# Patient Record
Sex: Female | Born: 1955 | Race: White | Hispanic: No | Marital: Married | Smoking: Never smoker
Health system: Southern US, Community
[De-identification: ages and names within clinical notes are randomized; demographics above are authoritative.]

## PROBLEM LIST (undated history)

## (undated) DIAGNOSIS — I214 Non-ST elevation (NSTEMI) myocardial infarction: Secondary | ICD-10-CM

## (undated) DIAGNOSIS — F32A Depression, unspecified: Secondary | ICD-10-CM

## (undated) DIAGNOSIS — E785 Hyperlipidemia, unspecified: Secondary | ICD-10-CM

## (undated) DIAGNOSIS — I10 Essential (primary) hypertension: Secondary | ICD-10-CM

## (undated) DIAGNOSIS — E119 Type 2 diabetes mellitus without complications: Secondary | ICD-10-CM

## (undated) DIAGNOSIS — F329 Major depressive disorder, single episode, unspecified: Secondary | ICD-10-CM

## (undated) DIAGNOSIS — I2510 Atherosclerotic heart disease of native coronary artery without angina pectoris: Secondary | ICD-10-CM

## (undated) HISTORY — DX: Non-ST elevation (NSTEMI) myocardial infarction: I21.4

## (undated) HISTORY — DX: Essential (primary) hypertension: I10

## (undated) HISTORY — PX: VAGINAL HYSTERECTOMY: SUR661

## (undated) HISTORY — PX: BREAST SURGERY: SHX581

## (undated) HISTORY — PX: BLADDER SUSPENSION: SHX72

## (undated) HISTORY — DX: Type 2 diabetes mellitus without complications: E11.9

## (undated) HISTORY — PX: TUBAL LIGATION: SHX77

## (undated) HISTORY — PX: TONSILLECTOMY: SUR1361

---

## 1986-12-27 HISTORY — PX: BACK SURGERY: SHX140

## 2002-12-27 HISTORY — PX: CHOLECYSTECTOMY: SHX55

## 2003-08-09 ENCOUNTER — Ambulatory Visit (HOSPITAL_COMMUNITY): Admission: RE | Admit: 2003-08-09 | Discharge: 2003-08-09 | Payer: Self-pay | Admitting: General Surgery

## 2004-06-01 ENCOUNTER — Observation Stay (HOSPITAL_COMMUNITY): Admission: EM | Admit: 2004-06-01 | Discharge: 2004-06-02 | Payer: Self-pay | Admitting: Cardiology

## 2007-11-30 ENCOUNTER — Emergency Department (HOSPITAL_COMMUNITY): Admission: EM | Admit: 2007-11-30 | Discharge: 2007-11-30 | Payer: Self-pay | Admitting: Emergency Medicine

## 2011-05-14 NOTE — H&P (Signed)
   NAME:  Andrea Hughes, HAZELBAKER                        ACCOUNT NO.:  [id number]   MEDICAL RECORD NO.:  [id number]                   PATIENT TYPE:  AMB   LOCATION:  DAY                                  FACILITY:  [facility]   PHYSICIAN:  Dalia Heading, M.D.               DATE OF BIRTH:  April 24, 1956   DATE OF ADMISSION:  DATE OF DISCHARGE:                                HISTORY & PHYSICAL   CHIEF COMPLAINT:  Biliary colic, cholelithiasis.   HISTORY OF PRESENT ILLNESS:  The patient is a 55 year old white female who  was referred for evaluation and treatment of biliary colic secondary to  cholelithiasis.  She has been having right upper quadrant discomfort,  nausea, post prandial symptoms, fatty food intolerance, indigestion, and  bloating for some time.  The pain radiates to her right flank.  No fever,  chills, jaundice have been noted.  The symptoms seem to be worsening.   PAST MEDICAL HISTORY:  Hypertension.   PAST SURGICAL HISTORY:  1. Breast surgery.  2. Tubal ligation.  3. Back surgery.   CURRENT MEDICATIONS:  Triamterene/hydrochlorothiazide 37.5/25 mg p.o. every  day.   ALLERGIES:  Contrast dye.   REVIEW OF SYSTEMS:  Noncontributory.   PHYSICAL EXAMINATION:  GENERAL:  The patient is an obese white female in no  acute distress.  VITAL SIGNS:  She is afebrile and vital signs are stable.  HEENT:  Reveals no scleral icterus.  LUNGS:  Clear to auscultation with equal breath sounds bilaterally.  HEART:  Reveals a regular, rate and rhythm without S3, S4 or murmurs.  ABDOMEN:  Soft and nondistended.  She is slightly tender in the right upper  quadrant to palpation.  No hepatosplenomegaly, masses, hernias are  identified.   Ultrasound of the gallbladder reveals cholelithiasis with a normal common  bile duct.   IMPRESSION:  1. Biliary colic.  2. Cholelithiasis.    PLAN:  The patient was scheduled for laparoscopic cholecystectomy on August 09, 2003.  The risks and benefits of the  procedure including bleeding,  infection, hepatobiliary injury, the possibility of an open procedure were  fully explained to the patient, gained informed consent.                                                 Dalia Heading, M.D.    MAJ/MEDQ  D:  08/06/2003  T:  08/06/2003  Job:  [id number]

## 2011-05-14 NOTE — Cardiovascular Report (Signed)
NAME:  Andrea Hughes, Andrea Hughes                        ACCOUNT NO.:  [id number]   MEDICAL RECORD NO.:  [id number]                   PATIENT TYPE:  INP   LOCATION:  3727                                 FACILITY:  [facility]   PHYSICIAN:  Carole Binning, M.D. [facility]         DATE OF BIRTH:  1956/05/06   DATE OF PROCEDURE:  06/02/2004  DATE OF DISCHARGE:  06/02/2004                              CARDIAC CATHETERIZATION   PROCEDURE PERFORMED:  Left heart catheterization with coronary angiography  and left ventriculography.   CARDIOLOGIST:  Carole Binning, M.D.   INDICATIONS:  [name] is a 55 year old woman with multiple cardiac risk  factors including diabetes mellitus and metabolic syndrome.  She presented  with chest pain to [facility].  A stress Cardiolite  performed showed an inferior defect consistent with diaphragmatic  attenuation or inferior infarct.  Because of the uncertainty of this  Cardiolite she was referred for cardiac catheterization to rule out coronary  artery disease.   DESCRIPTION OF PROCEDURE:  A 6 French sheath was placed in the left femoral  artery.  Coronary angiography was performed with standard Judkins 6 French  catheters.  Left ventriculography was performed with an angled pigtail  catheter.  Contrast was Omnipaque.  There were no complications.   RESULTS:  HEMODYNAMICS:  Left ventricular pressure 134/6. Aortic pressure  140/92.  There was no aortic valve gradient.   LEFT VENTRICULOGRAM:  The wall motion is normal.  Ejection fraction is  estimated at greater than or equal to 60%.  There is no mitral  regurgitation.   CORONARY ARTERIOGRAPHY:  (Right dominant).   Left main:  Normal.   Left anterior descending coronary artery gives rise to a small first  diagonal branch, a normal second diagonal branch.  The LAD is normal.   The left circumflex gives rise to a small ramus intermedius, a very large  branching first obtuse marginal, a normal size  second obtuse marginal and a  small posterior lateral branch.  There is less than 20% stenosis in the  origin of the first obtuse marginal branch.  Otherwise, the left circumflex  is normal.   The right coronary artery is a dominant vessel giving rise to a large  posterior descending artery which arises from the acute margin.  There is a  small posterior lateral branch.   IMPRESSION:  1. Normal left ventricular systolic function.  2. No significant coronary artery disease identified.                                               Carole Binning, M.D. [facility]    MWP/MEDQ  D:  06/02/2004  T:  06/04/2004  Job:  [id number]   cc:   Selinda Flavin  [address] [place], [address]  [place]  [place] [address]  Fax: [phone]   [facility]   Cardiac Catheterization Lab

## 2011-05-14 NOTE — Op Note (Signed)
NAME:  Andrea Hughes, Andrea Hughes                        ACCOUNT NO.:  [id number]   MEDICAL RECORD NO.:  [id number]                   PATIENT TYPE:  AMB   LOCATION:  DAY                                  FACILITY:  [facility]   PHYSICIAN:  Dalia Heading, M.D.               DATE OF BIRTH:  [birth year]   DATE OF PROCEDURE:  08/09/2003  DATE OF DISCHARGE:                                 OPERATIVE REPORT   PREOPERATIVE DIAGNOSIS:  Cholecystitis, cholelithiasis.   POSTOPERATIVE DIAGNOSIS:  Cholecystitis, cholelithiasis.   PROCEDURE:  Laparoscopic cholecystectomy.   SURGEON:  Dalia Heading, M.D.   ASSISTANT:  Bernerd Limbo. Leona Carry, M.D.   ANESTHESIA:  General endotracheal.   INDICATIONS FOR PROCEDURE:  The patient is a 55 year old white female who  was referred for evaluation and treatment of biliary colic secondary to  cholelithiasis.  The risks and benefits of the procedure, including  bleeding, infection, hepatobiliary injury, and the possibility of an open  procedure were fully explained to the patient who gave informed consent.   DESCRIPTION OF PROCEDURE:  The patient was placed in the supine position.  After induction of general endotracheal anesthesia, the abdomen was prepped  and draped using the usual sterile technique with Betadine.  Surgical site  confirmation was performed.   A supraumbilical incision was made down to the fascia.  A Veress needle was  introduced into the abdominal cavity, and confirmation of placement was done  using the saline drop test.  The abdomen was then insufflated to 16 mmHg  pressure.  An 11-mm trocar was introduced into the abdominal cavity under  direct visualization without difficulty.  An additional 11-mm trocar was  placed in the epigastric region and 5-mm trocars were placed in the right  upper quadrant and right flank regions.  The liver was inspected and noted  to be within normal limits.  The gallbladder was retracted superiorly and  laterally.  The  dissection was begun around the infundibulum of the  gallbladder.  The cystic duct was first identified.  Its juncture to the  infundibulum was fully identified.  Endoclips were placed proximally and  distally on the cystic duct, and the cystic duct was divided.  This was  likewise done on the cystic artery.  The gallbladder was then freed away  from the gallbladder fossa using Bovie electrocautery.  The gallbladder was  delivered through the epigastric trocar site using an EndoCatch bag without  difficulty.  The gallbladder fossa was inspected, and no abnormal bleeding  or bile leakage was noted.  Surgicel was placed in the gallbladder fossa.  The subhepatic space as well as right hepatic gutter were irrigated with  normal saline.  All fluid and air were then evacuated from the abdominal  cavity prior to removal of the trocars.   All wounds were irrigated with normal saline.  All wounds were injected with  0.5% Sensorcaine.  The supraumbilical  fascia was reapproximated using an 0  Vicryl interrupted suture.  All skin incisions were closed using staples.  Betadine ointment and dry sterile dressings were applied.   All tape and needle counts were correct at the end of the procedure.  The  patient was extubated in the operating room and went back to the recovery  room awake and in stable condition.   COMPLICATIONS:  None.   SPECIMENS:  Gallbladder with stones.   ESTIMATED BLOOD LOSS:  Minimal.                                               Dalia Heading, M.D.    MAJ/MEDQ  D:  08/09/2003  T:  08/09/2003  Job:  [id number]

## 2011-05-14 NOTE — Discharge Summary (Signed)
NAME:  ALEECIA, TAPIA                        ACCOUNT NO.:  [id number]   MEDICAL RECORD NO.:  [id number]                   PATIENT TYPE:  INP   LOCATION:  3727                                 FACILITY:  [facility]   PHYSICIAN:  Rollene Rotunda, M.D.                DATE OF BIRTH:  28-Mar-1956   DATE OF ADMISSION:  06/01/2004  DATE OF DISCHARGE:  06/02/2004                                 DISCHARGE SUMMARY   PROCEDURES:  1. Cardiac catheterization.  2. Coronary arteriogram.  3. Left ventriculogram.   HOSPITAL COURSE:  Ms. Mellone is a 55 year old female with no known history  of coronary artery disease.  She went to [facility] on May 30, 2004  for chest pain for which she was seen by cardiology.  She ruled out for a  myocardial infarction and had a Cardiolite on June 01, 2004.  This showed  possible inferior defect and because of that she was transferred to [facility] for further evaluation and treatment.   Her potassium was 3.1 at [facility] and because of that she was supplemented  and started on a potassium tablet at 10 mEq daily. Prior to admission she  had been on Maxzide and it was felt that this was the cause.  Additionally,  it was found out that she has an ALLERGY to CONTRAST DYE and she was  pretreated for this with steroids, Benadryl and Pepcid.  The cardiac  catheterization was performed and showed a less than 20% stenosis in the  circumflex and no other significant disease and a normal ejection fraction.  Dr. Gerri Spore reviewed the films and felt that she had no significant  coronary artery disease and could be followed as an outpatient.   Post catheterization her right groin was stable and it was felt she could be  discharged with outpatient follow up.   CONDITION ON DISCHARGE:  Improved.   DISCHARGE DIAGNOSES:  1. Chest pain, no critical coronary artery disease by catheterization this     admission.  2. Hypertension.  3. Hyperlipidemia.  4.  Diabetes.  5. Obesity.  6. Family history of valvular heart disease but no history of premature     ischemic heart disease.   DISCHARGE INSTRUCTIONS:  1. Her activity level is to include no strenuous activity for two days.  She     is to call the office for any problems with the catheterization site.  2. She is to stick to a low fat, diabetic diet.  3. She is to follow up with Dr. Neita Carp and is to follow up with cardiology     on a PRN basis.   DISCHARGE MEDICATIONS:  1. Avandamet 2/500 b.i.d.  2. Maxzide 25/37.5 mg daily.  3. Lipitor 20 mg daily.      Theodore Demark, P.A. [facility]                  Fayrene Fearing  Antoine Poche, M.D.    RB/MEDQ  D:  06/02/2004  T:  06/03/2004  Job:  [id number]   cc:   Selinda Flavin  [address]  [place]  [place] [address]  Fax: [phone]

## 2011-07-29 ENCOUNTER — Emergency Department (HOSPITAL_COMMUNITY): Payer: Worker's Compensation

## 2011-07-29 ENCOUNTER — Emergency Department (HOSPITAL_COMMUNITY)
Admission: EM | Admit: 2011-07-29 | Discharge: 2011-07-29 | Disposition: A | Discharge status: Home / Self Care | Payer: Worker's Compensation | Attending: Emergency Medicine | Admitting: Emergency Medicine

## 2011-07-29 DIAGNOSIS — S335XXA Sprain of ligaments of lumbar spine, initial encounter: Secondary | ICD-10-CM | POA: Insufficient documentation

## 2011-07-29 DIAGNOSIS — E119 Type 2 diabetes mellitus without complications: Secondary | ICD-10-CM | POA: Insufficient documentation

## 2011-07-29 DIAGNOSIS — I10 Essential (primary) hypertension: Secondary | ICD-10-CM | POA: Insufficient documentation

## 2011-07-29 DIAGNOSIS — M545 Low back pain, unspecified: Secondary | ICD-10-CM | POA: Insufficient documentation

## 2011-07-29 DIAGNOSIS — Z79899 Other long term (current) drug therapy: Secondary | ICD-10-CM | POA: Insufficient documentation

## 2011-07-29 DIAGNOSIS — W07XXXA Fall from chair, initial encounter: Secondary | ICD-10-CM | POA: Insufficient documentation

## 2011-08-12 NOTE — H&P (Signed)
NTS SOAP Note  Vital Signs:  Vitals as of: 08/12/2011: Systolic 158: Diastolic 103: Heart Rate 68: Temp 41F: Height 64ft 5in: Weight 302Lbs 0 Ounces: Pain Level 0: BMI 50  BMI : 50.25 kg/m2  Subjective: This 55 Years 1 Months old Female presents for of need for screening TCS. Denies any GI complaints.  Review of Symptoms:  Constitutional: unremarkable  Head: unremarkable  Eyes:unremarkable  Nose/Mouth/Throat:unremarkable  Cardiovascular:unremarkable  Respiratory: unremarkable  Gastrointestinal:unremarkable  Genitourinary: unremarkable  Musculoskeletal: unremarkable  dry skin Breast: unremarkable  Hematolgic/Lymphatic: unremarkable  Allergic/Immunologic:unremarkable     Past Medical History:Reviewed  Past Medical History  Surgical History: breast surgery, cholecystectomy, BTL Medical Problems: Diabetes, High Blood pressure Allergies: pcn, contrast dye, sulfur Medications: losartin/HCTZ, metformin   Social History: Reviewed   Social History  Preferred Language: English ([place]) Race: White Ethnicity: Not Hispanic / Latino Age: 55 Years 0 Months Marital Status: M Alcohol: No Recreational drug(s): No   Smoking Status: Never smoker reviewed on 08/12/2011  Family History: Reviewed  Family History  Is there a family history of:No family h/o colon carcinoma Father: Cancer Mother: Diabetes Type II   Objective Information:  General: Well appearing, well nourished in no distress.  Head: Atraumatic; no masses; no abnormalities  Neck: Supple without lymphadenopathy.  Heart: RRR, no murmur or gallop. Normal S1, S2. No S3, S4.  Lungs:CTA bilaterally, no wheezes, rhonchi, rales. Breathing unlabored.  Abdomen: Soft, NT/ND, normal bowel sounds, no HSM, no masses. No peritoneal signs.  deferred to procedure  Assessment: Need for screening TCS  Diagnosis & Procedure: DiagnosisCode: V76.51, ProcedureCode: 16109,   Orders:1/2 lyte  prescribed     Plan:Scheduled for TCS on 09/07/11.    Patient Education: Alternative treatments to surgery were discussed with patient (and family).Risks and benefits of procedure were fully explained to the patient (and family) who gave informed consent. Patient/family questions were addressed.  Follow-up: Pending Surgery

## 2011-09-06 MED ORDER — SODIUM CHLORIDE 0.45 % IV SOLN
Freq: Once | INTRAVENOUS | Status: AC
  Administered 2011-09-07: 08:00:00 via INTRAVENOUS

## 2011-09-07 ENCOUNTER — Encounter (HOSPITAL_COMMUNITY): Payer: Self-pay

## 2011-09-07 ENCOUNTER — Ambulatory Visit (HOSPITAL_COMMUNITY)
Admission: RE | Admit: 2011-09-07 | Discharge: 2011-09-07 | Disposition: A | Discharge status: Home / Self Care | Payer: 59 | Source: Ambulatory Visit | Attending: General Surgery | Admitting: General Surgery

## 2011-09-07 ENCOUNTER — Encounter (HOSPITAL_COMMUNITY)
Admission: RE | Disposition: A | Discharge status: Home / Self Care | Payer: Self-pay | Source: Ambulatory Visit | Attending: General Surgery

## 2011-09-07 DIAGNOSIS — Z79899 Other long term (current) drug therapy: Secondary | ICD-10-CM | POA: Insufficient documentation

## 2011-09-07 DIAGNOSIS — Z01812 Encounter for preprocedural laboratory examination: Secondary | ICD-10-CM | POA: Insufficient documentation

## 2011-09-07 DIAGNOSIS — Z1211 Encounter for screening for malignant neoplasm of colon: Secondary | ICD-10-CM | POA: Insufficient documentation

## 2011-09-07 DIAGNOSIS — E119 Type 2 diabetes mellitus without complications: Secondary | ICD-10-CM | POA: Insufficient documentation

## 2011-09-07 HISTORY — PX: COLONOSCOPY: SHX5424

## 2011-09-07 HISTORY — DX: Depression, unspecified: F32.A

## 2011-09-07 HISTORY — DX: Major depressive disorder, single episode, unspecified: F32.9

## 2011-09-07 LAB — GLUCOSE, CAPILLARY: Glucose-Capillary: 199 mg/dL — ABNORMAL HIGH (ref 70–99)

## 2011-09-07 SURGERY — COLONOSCOPY
Anesthesia: Moderate Sedation

## 2011-09-07 MED ORDER — MEPERIDINE HCL 25 MG/ML IJ SOLN
INTRAMUSCULAR | Status: DC | PRN
  Administered 2011-09-07: 50 mg via INTRAVENOUS

## 2011-09-07 MED ORDER — STERILE WATER FOR IRRIGATION IR SOLN
Status: DC | PRN
  Administered 2011-09-07: 08:00:00

## 2011-09-07 MED ORDER — MIDAZOLAM HCL 5 MG/5ML IJ SOLN
INTRAMUSCULAR | Status: DC | PRN
  Administered 2011-09-07: 4 mg via INTRAVENOUS
  Administered 2011-09-07: 1 mg via INTRAVENOUS

## 2011-09-07 MED ORDER — MEPERIDINE HCL 100 MG/ML IJ SOLN
INTRAMUSCULAR | Status: AC
  Filled 2011-09-07: qty 1

## 2011-09-07 MED ORDER — MIDAZOLAM HCL 5 MG/5ML IJ SOLN
INTRAMUSCULAR | Status: AC
  Filled 2011-09-07: qty 5

## 2011-09-15 ENCOUNTER — Encounter (HOSPITAL_COMMUNITY): Payer: Self-pay | Admitting: General Surgery

## 2014-09-26 DIAGNOSIS — I214 Non-ST elevation (NSTEMI) myocardial infarction: Secondary | ICD-10-CM

## 2014-09-26 HISTORY — DX: Non-ST elevation (NSTEMI) myocardial infarction: I21.4

## 2014-10-18 ENCOUNTER — Encounter (HOSPITAL_COMMUNITY): Payer: Self-pay | Admitting: Cardiology

## 2014-10-18 ENCOUNTER — Inpatient Hospital Stay (HOSPITAL_COMMUNITY)
Admission: AD | Admit: 2014-10-18 | Discharge: 2014-10-22 | DRG: 247 | Disposition: A | Discharge status: Home / Self Care | Payer: 59 | Source: Ambulatory Visit | Attending: Cardiology | Admitting: Cardiology

## 2014-10-18 DIAGNOSIS — I25110 Atherosclerotic heart disease of native coronary artery with unstable angina pectoris: Secondary | ICD-10-CM | POA: Diagnosis present

## 2014-10-18 DIAGNOSIS — I119 Hypertensive heart disease without heart failure: Secondary | ICD-10-CM | POA: Diagnosis present

## 2014-10-18 DIAGNOSIS — I214 Non-ST elevation (NSTEMI) myocardial infarction: Principal | ICD-10-CM

## 2014-10-18 DIAGNOSIS — E782 Mixed hyperlipidemia: Secondary | ICD-10-CM | POA: Diagnosis present

## 2014-10-18 DIAGNOSIS — Z880 Allergy status to penicillin: Secondary | ICD-10-CM | POA: Diagnosis not present

## 2014-10-18 DIAGNOSIS — E876 Hypokalemia: Secondary | ICD-10-CM | POA: Diagnosis present

## 2014-10-18 DIAGNOSIS — Z882 Allergy status to sulfonamides status: Secondary | ICD-10-CM | POA: Diagnosis not present

## 2014-10-18 DIAGNOSIS — Z955 Presence of coronary angioplasty implant and graft: Secondary | ICD-10-CM

## 2014-10-18 DIAGNOSIS — Z79899 Other long term (current) drug therapy: Secondary | ICD-10-CM

## 2014-10-18 DIAGNOSIS — R079 Chest pain, unspecified: Secondary | ICD-10-CM | POA: Diagnosis present

## 2014-10-18 DIAGNOSIS — I10 Essential (primary) hypertension: Secondary | ICD-10-CM

## 2014-10-18 DIAGNOSIS — E1151 Type 2 diabetes mellitus with diabetic peripheral angiopathy without gangrene: Secondary | ICD-10-CM | POA: Diagnosis present

## 2014-10-18 DIAGNOSIS — E1159 Type 2 diabetes mellitus with other circulatory complications: Secondary | ICD-10-CM

## 2014-10-18 DIAGNOSIS — E785 Hyperlipidemia, unspecified: Secondary | ICD-10-CM | POA: Diagnosis present

## 2014-10-18 DIAGNOSIS — Z6841 Body Mass Index (BMI) 40.0 and over, adult: Secondary | ICD-10-CM

## 2014-10-18 HISTORY — DX: Morbid (severe) obesity due to excess calories: E66.01

## 2014-10-18 HISTORY — DX: Atherosclerotic heart disease of native coronary artery without angina pectoris: I25.10

## 2014-10-18 HISTORY — DX: Hyperlipidemia, unspecified: E78.5

## 2014-10-18 LAB — TSH: TSH: 1.97 u[IU]/mL (ref 0.350–4.500)

## 2014-10-18 LAB — GLUCOSE, CAPILLARY: Glucose-Capillary: 145 mg/dL — ABNORMAL HIGH (ref 70–99)

## 2014-10-18 LAB — HEPARIN LEVEL (UNFRACTIONATED): HEPARIN UNFRACTIONATED: 0.13 [IU]/mL — AB (ref 0.30–0.70)

## 2014-10-18 LAB — MRSA PCR SCREENING: MRSA BY PCR: NEGATIVE

## 2014-10-18 MED ORDER — ASPIRIN EC 81 MG PO TBEC
81.0000 mg | DELAYED_RELEASE_TABLET | Freq: Every day | ORAL | Status: DC
  Administered 2014-10-19 – 2014-10-22 (×3): 81 mg via ORAL
  Filled 2014-10-18 (×3): qty 1

## 2014-10-18 MED ORDER — LOSARTAN POTASSIUM 50 MG PO TABS
100.0000 mg | ORAL_TABLET | Freq: Every day | ORAL | Status: DC
  Administered 2014-10-18 – 2014-10-22 (×4): 100 mg via ORAL
  Filled 2014-10-18 (×4): qty 2

## 2014-10-18 MED ORDER — SODIUM CHLORIDE 0.9 % IJ SOLN: 3.0000 mL | INTRAMUSCULAR | Status: DC | PRN

## 2014-10-18 MED ORDER — NITROGLYCERIN 0.4 MG SL SUBL: 0.4000 mg | SUBLINGUAL_TABLET | SUBLINGUAL | Status: DC | PRN

## 2014-10-18 MED ORDER — NITROGLYCERIN 2 % TD OINT
1.0000 [in_us] | TOPICAL_OINTMENT | Freq: Four times a day (QID) | TRANSDERMAL | Status: DC
  Administered 2014-10-18 – 2014-10-21 (×13): 1 [in_us] via TOPICAL
  Filled 2014-10-18 (×2): qty 30

## 2014-10-18 MED ORDER — SODIUM CHLORIDE 0.9 % IJ SOLN
3.0000 mL | Freq: Two times a day (BID) | INTRAMUSCULAR | Status: DC
  Administered 2014-10-18 – 2014-10-20 (×4): 3 mL via INTRAVENOUS

## 2014-10-18 MED ORDER — ONDANSETRON HCL 4 MG/2ML IJ SOLN: 4.0000 mg | Freq: Four times a day (QID) | INTRAMUSCULAR | Status: DC | PRN

## 2014-10-18 MED ORDER — METFORMIN HCL 500 MG PO TABS
500.0000 mg | ORAL_TABLET | Freq: Two times a day (BID) | ORAL | Status: DC
  Administered 2014-10-19 – 2014-10-20 (×4): 500 mg via ORAL
  Filled 2014-10-18 (×8): qty 1

## 2014-10-18 MED ORDER — METOPROLOL TARTRATE 12.5 MG HALF TABLET
12.5000 mg | ORAL_TABLET | Freq: Two times a day (BID) | ORAL | Status: DC
  Administered 2014-10-18 – 2014-10-19 (×3): 12.5 mg via ORAL
  Filled 2014-10-18 (×5): qty 1

## 2014-10-18 MED ORDER — SODIUM CHLORIDE 0.9 % IV SOLN
250.0000 mL | INTRAVENOUS | Status: DC | PRN
  Administered 2014-10-18: 1000 mL via INTRAVENOUS

## 2014-10-18 MED ORDER — HEPARIN BOLUS VIA INFUSION
2600.0000 [IU] | Freq: Once | INTRAVENOUS | Status: AC
Start: 2014-10-18 — End: 2014-10-18
  Administered 2014-10-18: 2600 [IU] via INTRAVENOUS
  Filled 2014-10-18: qty 2600

## 2014-10-18 MED ORDER — HEPARIN (PORCINE) IN NACL 100-0.45 UNIT/ML-% IJ SOLN
1600.0000 [IU]/h | INTRAMUSCULAR | Status: DC
  Administered 2014-10-18: 1200 [IU]/h via INTRAVENOUS
  Administered 2014-10-19 (×2): 1500 [IU]/h via INTRAVENOUS
  Administered 2014-10-20 – 2014-10-21 (×2): 1600 [IU]/h via INTRAVENOUS
  Filled 2014-10-18 (×7): qty 250

## 2014-10-18 MED ORDER — ATORVASTATIN CALCIUM 80 MG PO TABS
80.0000 mg | ORAL_TABLET | Freq: Every day | ORAL | Status: DC
  Administered 2014-10-19 – 2014-10-21 (×3): 80 mg via ORAL
  Filled 2014-10-18 (×5): qty 1

## 2014-10-18 MED ORDER — HEPARIN BOLUS VIA INFUSION
4000.0000 [IU] | Freq: Once | INTRAVENOUS | Status: DC
  Filled 2014-10-18: qty 4000

## 2014-10-18 MED ORDER — ACETAMINOPHEN 325 MG PO TABS
650.0000 mg | ORAL_TABLET | ORAL | Status: DC | PRN
  Administered 2014-10-19: 650 mg via ORAL
  Filled 2014-10-18: qty 2

## 2014-10-18 NOTE — Progress Notes (Signed)
ANTICOAGULATION CONSULT NOTE - Follow Up Consult  Pharmacy Consult for Heparin Indication: chest pain/ACS  Allergies  Allergen Reactions  . Sulfa Drugs Cross Reactors Swelling  . Penicillins Rash    Patient Measurements: Height: 5\' 4"  (162.6 cm) Weight: 264 lb 15.9 oz (120.2 kg) IBW/kg (Calculated) : 54.7 Heparin Dosing Weight: 88 kg  Vital Signs: Temp: 97.6 F (36.4 C) (10/23 2000) Temp Source: Oral (10/23 2000) BP: 150/76 mmHg (10/23 2200) Pulse Rate: 78 (10/23 2200)  Labs:  Recent Labs  10/18/14 1922 10/18/14 2100  HEPARINUNFRC  --  0.13*  TROPONINI CRITICAL RESULT CALLED TO, READ BACK BY AND VERIFIED WITH:  --     CrCl is unknown because no creatinine reading has been taken.   Medications:  Infusions:  . heparin 1,200 Units/hr (10/18/14 1350)    Assessment: 58 year old female on IV heparin for ACS. Initial heparin level is sub-therapeutic at 0.13 on rate of 1200 units/hr.   Goal of Therapy:  Heparin level 0.3-0.7 units/ml Monitor platelets by anticoagulation protocol: Yes   Plan:  1. Bolus heparin 2600 units x1. 2. Increase heparin up to 1500 units/hr.  3. Recheck heparin level in 6 hours (am labs).  4. Daily heparin level while on therapy.   Link SnufferJessica Maleik Vanderzee, PharmD, BCPS Clinical Pharmacist [phone] 10/18/2014,10:54 PM

## 2014-10-18 NOTE — Progress Notes (Signed)
CRITICAL VALUE ALERT  Critical value received:  Troponin=1.02  Date of notification:  10/18/2014  Time of notification:  2024  Critical value read back:Yes.    Nurse who received alert:  Exie ParodySandra Merlini  MD notified (1st page):  Dr Chilton SiGreen   Time of first page:  2028  MD notified (2nd page):  Time of second page:  Responding MD:  Dr Chilton SiGreen  Time MD responded:  2028

## 2014-10-18 NOTE — H&P (Signed)
CARDIOLOGY ADMISSION NOTE  Patient ID: Andrea Hughes MRN: [id number] DOB/AGE: 1956-05-20 58 y.o.  Admit date: 10/18/2014 Primary Physician    Primary Cardiologist   None Chief Complaint    Chest pain  HPI:  She did have a negative cardiac cath in 2005.  The [facility].  She was at work yesterday when this started. She described a 5/10 substernal heaviness. There was some radiation around her shoulder and into both of her arms. She has not had pain like this before. She does not describe associated nausea vomiting or diaphoresis. She denies any palpitations, presyncope or syncope. He [facility] where she had some nonspecific T-wave changes as described. She did have a mildly elevated enzyme which increased on repeat testing. She was made pain free with nitroglycerin paste, aspirin and heparin. However, she did have some recurrent discomfort when getting up to the bathroom. She otherwise has felt well. She otherwise denies any chest pressure, neck or arm discomfort. She's had no shortness of breath, PND or orthopnea. He's had no palpitations, presyncope or syncope. She elects to sleep in a chair because of back pain.  Past Medical History  Diagnosis Date  . Hypertension   . Diabetes mellitus   . Depression     Past Surgical History  Procedure Laterality Date  . Tubal ligation    . Back surgery  1988  . Cholecystectomy  2004  . Breast surgery      1984  . Tonsillectomy      58 years old  . Colonoscopy  09/07/2011    Procedure: COLONOSCOPY;  Surgeon: Dalia HeadingMark A Jenkins;  Location: [facility];  Service: Gastroenterology;  Laterality: N/A;    Allergies  Allergen Reactions  . Penicillins   . Sulfa Drugs Cross Reactors    No current facility-administered medications on file prior to encounter.   Current Outpatient Prescriptions on File Prior to Encounter  Medication Sig Dispense Refill  . losartan (COZAAR) 100 MG tablet Take  100 mg by mouth daily.        . metFORMIN (GLUCOPHAGE) 1000 MG tablet Take 500 mg by mouth 2 (two) times daily with a meal.         History   Social History  . Marital Status: Married    Spouse Name: N/A    Number of Children: N/A  . Years of Education: N/A   Occupational History  . Not on file.   Social History Main Topics  . Smoking status: Never Smoker   . Smokeless tobacco: Not on file  . Alcohol Use: No  . Drug Use: No  . Sexual Activity:    Other Topics Concern  . Not on file   Social History Narrative  . No narrative on file    No family history on file.   ROS:   Positive for diarrhea. Back pain.    As stated in the HPI and negative for all other systems.  Physical Exam: Blood pressure 151/72, pulse 68, temperature 98.2 F (36.8 C), temperature source Oral, resp. rate 11, SpO2 100.00%. GENERAL:  Well appearing HEENT:  Pupils equal round and reactive, fundi not visualized, oral mucosa unremarkable NECK:  No jugular venous distention, waveform within normal limits, carotid upstroke brisk and symmetric, no bruits, no thyromegaly LYMPHATICS:  No cervical, inguinal adenopathy LUNGS:  Clear to auscultation bilaterally BACK:  No CVA tenderness CHEST:  Unremarkable HEART:  PMI not displaced or sustained,S1 and S2 within  normal limits, no S3, no S4, no clicks, no rubs, no murmurs ABD:  Flat, positive bowel sounds normal in frequency in pitch, no bruits, no rebound, no guarding, no midline pulsatile mass, no hepatomegaly, no splenomegaly EXT:  2 plus pulses throughout, no edema, no cyanosis no clubbing SKIN:  No rashes no nodules NEURO:  Cranial nerves II through XII grossly intact, motor grossly intact throughout PSYCH:  Cognitively intact, oriented to person place and time  LABS:  Hbg 12.9, WBC 7.3, Plts 198, D dimer 0.75, BNP 70, BUN 14, creat 0.61, Trop 0.11, potassium 3.2, Na 141  EKG:   NSR, RATE 63, axis WNL, LAD, interior T wave inversion,  Early transition.   10/18/2014  CXR:  Chronic bronchitic changes.  [facility]  ASSESSMENT AND PLAN:    UNSTABLE ANGINA:  NQWMI.  Unable to cath today secondary to staffing.  Cath electively Monday unless she is unstable over the weekend.  Admit with heparin, ASA, NTG paste.  Low dose beta blocker.  Discussed the cath with the patient.  The patient understands that risks included but are not limited to stroke (1 in 1000), death (1 in 1000), kidney failure [usually temporary] (1 in 500), bleeding (1 in 200), allergic reaction [possibly serious] (1 in 200).  The patient understands and agrees to proceed.   HYPOKALEMIA:   Supplement.  RISK REDUCTION:  Check lipids.  Start empiric statin  DM:  Check A1c which she reports is elevated above 8.  For now I will continue Glucophage.  Hold for cath per protocol.  HTN:  Continue ARB  Signed: Rollene RotundaJames Quince Santana 10/18/2014, 4:59 PM

## 2014-10-18 NOTE — Progress Notes (Addendum)
ANTICOAGULATION CONSULT NOTE - Initial Consult  Pharmacy Consult for heparin Indication: chest pain/ACS  Allergies  Allergen Reactions  . Penicillins   . Sulfa Drugs Cross Reactors     Patient Measurements: Wt= 135kg Ht= 5' 4'' IBW= 54.7kg Heparin Dosing Weight: 88kg  Vital Signs: Temp: 98.2 F (36.8 C) (10/23 1630) Temp Source: Oral (10/23 1630) BP: 151/72 mmHg (10/23 1645) Pulse Rate: 68 (10/23 1645)  Labs: No results found for this basename: HGB, HCT, PLT, APTT, LABPROT, INR, HEPARINUNFRC, CREATININE, CKTOTAL, CKMB, TROPONINI,  in the last 72 hours  CrCl is unknown because no creatinine reading has been taken.   Medical History: Past Medical History  Diagnosis Date  . Hypertension   . Diabetes mellitus   . Depression   . Hyperlipidemia     Medications:  Prescriptions prior to admission  Medication Sig Dispense Refill  . losartan (COZAAR) 100 MG tablet Take 100 mg by mouth daily.        . metFORMIN (GLUCOPHAGE) 1000 MG tablet Take 500 mg by mouth 2 (two) times daily with a meal.         Scheduled:  . [START ON 10/19/2014] aspirin EC  81 mg Oral Daily  . [START ON 10/19/2014] atorvastatin  80 mg Oral q1800  . losartan  100 mg Oral Daily  . [START ON 10/19/2014] metFORMIN  500 mg Oral BID WC  . metoprolol tartrate  12.5 mg Oral BID  . nitroGLYCERIN  1 inch Topical 4 times per day  . sodium chloride  3 mL Intravenous Q12H    Assessment: 58 yo female with CP to begin heparin. Noted plans for cath on Monday.   Goal of Therapy:  Heparin level 0.3-0.7 units/ml Monitor platelets by anticoagulation protocol: Yes   Plan:  -Heparin bolus 4000 units IV followed by 1200units/hr (~14 units/kg/hr) -Heparin level in 6 hours and daily wth CBC daily  Harland Germanndrew Meyer, Pharm D 10/18/2014 6:10 PM   Addn: Patient received heparin bolus of 5000 units then was started on 1200 units/hr at outside hospital. Pt will resume 1200 unit/hr heparin rate and won't receive heparin  bolus as initially entered.  Arlean Hoppingorey M. Newman PiesBall, PharmD Clinical Pharmacist Pager [phone]

## 2014-10-19 DIAGNOSIS — E785 Hyperlipidemia, unspecified: Secondary | ICD-10-CM

## 2014-10-19 DIAGNOSIS — I119 Hypertensive heart disease without heart failure: Secondary | ICD-10-CM

## 2014-10-19 DIAGNOSIS — E1159 Type 2 diabetes mellitus with other circulatory complications: Secondary | ICD-10-CM

## 2014-10-19 LAB — BASIC METABOLIC PANEL
Anion gap: 12 (ref 5–15)
BUN: 11 mg/dL (ref 6–23)
CHLORIDE: 107 meq/L (ref 96–112)
CO2: 25 mEq/L (ref 19–32)
Calcium: 8.9 mg/dL (ref 8.4–10.5)
Creatinine, Ser: 0.57 mg/dL (ref 0.50–1.10)
GFR calc Af Amer: >90 mL/min (ref >90)
GFR calc non Af Amer: >90 mL/min (ref >90)
GLUCOSE: 132 mg/dL — AB (ref 70–99)
POTASSIUM: 3.5 meq/L — AB (ref 3.7–5.3)
SODIUM: 144 meq/L (ref 137–147)

## 2014-10-19 LAB — LIPID PANEL
CHOL/HDL RATIO: 4 ratio
Cholesterol: 188 mg/dL (ref 0–200)
HDL: 47 mg/dL (ref >39)
LDL CALC: 113 mg/dL — AB (ref 0–99)
Triglycerides: 138 mg/dL (ref <150)
VLDL: 28 mg/dL (ref 0–40)

## 2014-10-19 LAB — CBC
HCT: 35.8 % — ABNORMAL LOW (ref 36.0–46.0)
HEMOGLOBIN: 12.2 g/dL (ref 12.0–15.0)
MCH: 27.5 pg (ref 26.0–34.0)
MCHC: 34.1 g/dL (ref 30.0–36.0)
MCV: 80.6 fL (ref 78.0–100.0)
Platelets: 167 10*3/uL (ref 150–400)
RBC: 4.44 MIL/uL (ref 3.87–5.11)
RDW: 13.8 % (ref 11.5–15.5)
WBC: 6.7 10*3/uL (ref 4.0–10.5)

## 2014-10-19 LAB — GLUCOSE, CAPILLARY
GLUCOSE-CAPILLARY: 119 mg/dL — AB (ref 70–99)
GLUCOSE-CAPILLARY: 140 mg/dL — AB (ref 70–99)
Glucose-Capillary: 142 mg/dL — ABNORMAL HIGH (ref 70–99)

## 2014-10-19 LAB — HEMOGLOBIN A1C
Hgb A1c MFr Bld: 6.5 % — ABNORMAL HIGH (ref <5.7)
Mean Plasma Glucose: 140 mg/dL — ABNORMAL HIGH (ref <117)

## 2014-10-19 LAB — TROPONIN I
Troponin I: 1.02 ng/mL (ref <0.30)
Troponin I: 1.03 ng/mL (ref <0.30)
Troponin I: 2.02 ng/mL (ref <0.30)

## 2014-10-19 LAB — HEPARIN LEVEL (UNFRACTIONATED)
Heparin Unfractionated: 0.41 IU/mL (ref 0.30–0.70)
Heparin Unfractionated: 0.36 IU/mL (ref 0.30–0.70)

## 2014-10-19 NOTE — Progress Notes (Addendum)
CC: acute MI  SUBJECTIVE: The patient is doing reasonably well today.  She continues to have SOB with exertion.  At this time, she denies chest pain at rest, shortness of breath, or any new concerns.  Marland Kitchen. aspirin EC  81 mg Oral Daily  . atorvastatin  80 mg Oral q1800  . losartan  100 mg Oral Daily  . metFORMIN  500 mg Oral BID WC  . metoprolol tartrate  12.5 mg Oral BID  . nitroGLYCERIN  1 inch Topical 4 times per day  . sodium chloride  3 mL Intravenous Q12H   . heparin 1,500 Units/hr (10/19/14 0446)    OBJECTIVE: Physical Exam: Filed Vitals:   10/19/14 0300 10/19/14 0330 10/19/14 0400 10/19/14 0804  BP: 138/64 138/64  147/62  Pulse: 69 75 78   Temp: 97.9 F (36.6 C)   97.7 F (36.5 C)  TempSrc: Oral   Oral  Resp: 13 16 19 14   Height:      Weight:      SpO2: 96% 96% 100% 100%    Intake/Output Summary (Last 24 hours) at 10/19/14 0915 Last data filed at 10/19/14 0850  Gross per 24 hour  Intake  963.5 ml  Output    825 ml  Net  138.5 ml    Telemetry reveals sinus rhythm  GEN- The patient is overweight appearing, alert and oriented x 3 today.   Head- normocephalic, atraumatic Eyes-  Sclera clear, conjunctiva pink Ears- hearing intact Oropharynx- clear Neck- supple,  Lungs- Clear to ausculation bilaterally, normal work of breathing Heart- Regular rate and rhythm, no murmurs, rubs or gallops, PMI not laterally displaced GI- soft, NT, ND, + BS Extremities- no clubbing, cyanosis, or edema Skin- no rash or lesion Psych- euthymic mood, full affect Neuro- strength and sensation are intact  LABS: Basic Metabolic Panel:  Recent Labs  19/14/7810/24/15 0633  NA 144  K 3.5*  CL 107  CO2 25  GLUCOSE 132*  BUN 11  CREATININE 0.57  CALCIUM 8.9   Liver Function Tests: No results found for this basename: AST, ALT, ALKPHOS, BILITOT, PROT, ALBUMIN,  in the last 72 hours No results found for this basename: LIPASE, AMYLASE,  in the last 72 hours CBC:  Recent Labs  10/19/14 0633  WBC 6.7  HGB 12.2  HCT 35.8*  MCV 80.6  PLT 167   Cardiac Enzymes:  Recent Labs  10/18/14 1922 10/18/14 2335 10/19/14 0633  TROPONINI 1.02* 1.03* 2.02*   BNP: No components found with this basename: POCBNP,  D-Dimer: No results found for this basename: DDIMER,  in the last 72 hours Hemoglobin A1C:  Recent Labs  10/18/14 1922  HGBA1C 6.5*   Fasting Lipid Panel:  Recent Labs  10/19/14 0633  CHOL 188  HDL 47  LDLCALC 113*  TRIG 138  CHOLHDL 4.0   Thyroid Function Tests:  Recent Labs  10/18/14 1922  TSH 1.970   Anemia Panel: No results found for this basename: VITAMINB12, FOLATE, FERRITIN, TIBC, IRON, RETICCTPCT,  in the last 72 hours  RADIOLOGY: No results found.  ASSESSMENT AND PLAN:  Principal Problem:   NSTEMI (non-ST elevated myocardial infarction) Active Problems:   Diabetes  1. [place]/ NSTEMI The patient has modest troponin rise which is ongoing.  She has exertional CP but is CP free at rest.  I worry that she has an ustable plaque and is at risk for acute decompensation.  I will therefore keep her in the TCU today for very close observation and management.  Continue  IV heparin, ASA, NTG paste. Low dose beta blocker _-> titrate as above.  She is understanding of risks of cath and would like to proceed on Monday.  IF she decompensates then we will plan to proceed with cath sooner.  2. Hypertensive cardiovascular disease Resume losartan today  3. Diabetes with circulatory complications (CAD/ [place]) For now I will continue Glucophage. Hold for cath per protocol.   4. HL Lipids are reviewed Goal LDL <70 Statin initiated  5. Obesity Body mass index is 45.46 kg/(m^2). Weight reduction is advised  Hillis RangeAllred, Cem Kosman, MD 10/19/2014 9:15 AM

## 2014-10-19 NOTE — Progress Notes (Addendum)
ANTICOAGULATION CONSULT NOTE - Follow Up Consult  Pharmacy Consult for Heparin Indication: chest pain/ACS  Allergies  Allergen Reactions  . Sulfa Drugs Cross Reactors Swelling  . Penicillins Rash    Patient Measurements: Height: 5\' 4"  (162.6 cm) Weight: 264 lb 15.9 oz (120.2 kg) IBW/kg (Calculated) : 54.7 Heparin Dosing Weight: 88 kg  Vital Signs: Temp: 97.9 F (36.6 C) (10/24 0300) Temp Source: Oral (10/24 0300) BP: 138/64 mmHg (10/24 0330) Pulse Rate: 78 (10/24 0400)  Labs:  Recent Labs  10/18/14 1922 10/18/14 2100 10/18/14 2335 10/19/14 0633  HGB  --   --   --  12.2  HCT  --   --   --  35.8*  PLT  --   --   --  167  HEPARINUNFRC  --  0.13*  --  0.41  TROPONINI 1.02*  --  1.03*  --     CrCl is unknown because no creatinine reading has been taken.   Medications:  Infusions:  . heparin 1,500 Units/hr (10/19/14 0446)    Assessment: 58 year old female on IV heparin for ACS. HL is now therapeutic after re-bolus and increase in rate. CBC wnl and no reported s/s bleeding.  Goal of Therapy:  Heparin level 0.3-0.7 units/ml Monitor platelets by anticoagulation protocol: Yes   Plan:  - Continue heparin at 1500 units/hr - Recheck heparin level in 6 hours to confirm - Daily heparin level while on therapy   Margie BilletErika K. von Vajna, PharmD Clinical Pharmacist - Resident Pager: [phone] Pharmacy: [phone] 10/19/2014 7:28 AM   Addendum: - Confirmatory HL remains therapeutic at 0.36 - Cont hep gtt at 1500 u/hr - Daily HL/CBC  Margie BilletErika K. von Vajna, PharmD Clinical Pharmacist - Resident Pager: [phone] Pharmacy: [phone] 10/19/2014 2:33 PM

## 2014-10-19 NOTE — Progress Notes (Addendum)
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

## 2014-10-20 LAB — CBC
HCT: 36.6 % (ref 36.0–46.0)
HEMOGLOBIN: 12.4 g/dL (ref 12.0–15.0)
MCH: 28.4 pg (ref 26.0–34.0)
MCHC: 33.9 g/dL (ref 30.0–36.0)
MCV: 83.8 fL (ref 78.0–100.0)
Platelets: 137 10*3/uL — ABNORMAL LOW (ref 150–400)
RBC: 4.37 MIL/uL (ref 3.87–5.11)
RDW: 14.2 % (ref 11.5–15.5)
WBC: 6.5 10*3/uL (ref 4.0–10.5)

## 2014-10-20 LAB — HEPARIN LEVEL (UNFRACTIONATED)
HEPARIN UNFRACTIONATED: 0.28 [IU]/mL — AB (ref 0.30–0.70)
HEPARIN UNFRACTIONATED: 0.4 [IU]/mL (ref 0.30–0.70)
Heparin Unfractionated: 0.43 IU/mL (ref 0.30–0.70)

## 2014-10-20 LAB — GLUCOSE, CAPILLARY
GLUCOSE-CAPILLARY: 148 mg/dL — AB (ref 70–99)
Glucose-Capillary: 120 mg/dL — ABNORMAL HIGH (ref 70–99)
Glucose-Capillary: 133 mg/dL — ABNORMAL HIGH (ref 70–99)
Glucose-Capillary: 155 mg/dL — ABNORMAL HIGH (ref 70–99)

## 2014-10-20 MED ORDER — ALUM & MAG HYDROXIDE-SIMETH 200-200-20 MG/5ML PO SUSP
30.0000 mL | Freq: Four times a day (QID) | ORAL | Status: DC | PRN
  Administered 2014-10-20 (×2): 30 mL via ORAL
  Filled 2014-10-20 (×2): qty 30

## 2014-10-20 MED ORDER — METOPROLOL TARTRATE 50 MG PO TABS
50.0000 mg | ORAL_TABLET | Freq: Two times a day (BID) | ORAL | Status: DC
  Administered 2014-10-20 (×2): 50 mg via ORAL
  Filled 2014-10-20 (×3): qty 1

## 2014-10-20 NOTE — Progress Notes (Signed)
ANTICOAGULATION CONSULT NOTE - Follow Up Consult  Pharmacy Consult for heparin Indication: chest pain/ACS/NSTEMI  Allergies  Allergen Reactions  . Sulfa Drugs Cross Reactors Swelling  . Penicillins Rash    Patient Measurements: Height: 5\' 4"  (162.6 cm) Weight: 265 lb 6.4 oz (120.385 kg) IBW/kg (Calculated) : 54.7 Heparin Dosing Weight: 84kg  Vital Signs: Temp: 98.5 F (36.9 C) (10/25 1500) Temp Source: Oral (10/25 1500) BP: 159/80 mmHg (10/25 1500) Pulse Rate: 73 (10/25 1500)  Labs:  Recent Labs  10/18/14 1922  10/18/14 2100 10/18/14 2335 10/19/14 0633  10/20/14 0230 10/20/14 0930 10/20/14 1550  HGB  --   --   --   --  12.2  --  12.4  --   --   HCT  --   --   --   --  35.8*  --  36.6  --   --   PLT  --   --   --   --  167  --  137*  --   --   HEPARINUNFRC  --   < > 0.13*  --  0.41  < > 0.28* 0.40 0.43  CREATININE  --   --   --   --  0.57  --   --   --   --   TROPONINI 1.02*  --   --  1.03* 2.02*  --   --   --   --   < > = values in this interval not displayed.  Estimated Creatinine Clearance: 100.4 ml/min (by C-G formula based on Cr of 0.57).   Medications:  Infusions:  . heparin 1,600 Units/hr (10/20/14 1437)    Assessment: [age] yo F admitted on 10/23 on IV heparin for NSTEMI. HL that was previously therapeutic dropped this morning to 0.28 and the rate was increased. 6hr heparin level is therapeutic at 0.40. Hgb/Hct WNL and stable. PLTC slight dropped to 137. Cath planned for Monday. No bleeding noted. Confirm level is therapeutic again.   Goal of Therapy:  Heparin level 0.3-0.7 units/ml Monitor platelets by anticoagulation protocol: Yes   Plan:   Continue heparin gtt at 1600u/hr Daily level in AM  Ulyses SouthwardMinh Pham, PharmD Pager: [phone] 10/20/2014 5:01 PM

## 2014-10-20 NOTE — Progress Notes (Signed)
ANTICOAGULATION CONSULT NOTE - Follow Up Consult  Pharmacy Consult for heparin Indication: NSTEMI  Labs:  Recent Labs  10/18/14 1922  10/18/14 2100 10/18/14 2335 10/19/14 0633 10/19/14 1335 10/20/14 0230  HGB  --   --   --   --  12.2  --  12.4  HCT  --   --   --   --  35.8*  --  36.6  PLT  --   --   --   --  167  --  137*  HEPARINUNFRC  --   < > 0.13*  --  0.41 0.36 0.28*  CREATININE  --   --   --   --  0.57  --   --   TROPONINI 1.02*  --   --  1.03* 2.02*  --   --   < > = values in this interval not displayed.   Assessment: 58yo female now slightly subtherapeutic on heparin after two levels at goal though had been trending down.  Goal of Therapy:  Heparin level 0.3-0.7 units/ml   Plan:  Will increase heparin gtt slightly to 1600 units/hr and check level in 6hr.  Vernard GamblesVeronda Larnie Heart, PharmD, BCPS  10/20/2014,3:40 AM

## 2014-10-20 NOTE — Progress Notes (Signed)
CC: acute MI  SUBJECTIVE: The patient is doing reasonably well today.  She continues to have SOB with exertion.  At this time, she denies chest pain at rest, shortness of breath, or any new concerns.  Marland Kitchen. aspirin EC  81 mg Oral Daily  . atorvastatin  80 mg Oral q1800  . losartan  100 mg Oral Daily  . metFORMIN  500 mg Oral BID WC  . metoprolol tartrate  50 mg Oral BID  . nitroGLYCERIN  1 inch Topical 4 times per day  . sodium chloride  3 mL Intravenous Q12H   . heparin 1,600 Units/hr (10/20/14 0339)    OBJECTIVE: Physical Exam: Filed Vitals:   10/20/14 0000 10/20/14 0300 10/20/14 0400 10/20/14 0741  BP: 158/70  161/71 170/78  Pulse:    70  Temp: 98 F (36.7 C)  97.7 F (36.5 C) 97.3 F (36.3 C)  TempSrc: Oral  Oral Oral  Resp: 14  13   Height:      Weight:  265 lb 6.4 oz (120.385 kg)    SpO2:   98% 98%    Intake/Output Summary (Last 24 hours) at 10/20/14 0810 Last data filed at 10/20/14 0700  Gross per 24 hour  Intake 1503.35 ml  Output   1925 ml  Net -421.65 ml    Telemetry reveals sinus rhythm  GEN- The patient is overweight appearing, alert and oriented x 3 today.   Head- normocephalic, atraumatic Eyes-  Sclera clear, conjunctiva pink Ears- hearing intact Oropharynx- clear Neck- supple,  Lungs- Clear to ausculation bilaterally, normal work of breathing Heart- Regular rate and rhythm, no murmurs, rubs or gallops, PMI not laterally displaced GI- soft, NT, ND, + BS Extremities- no clubbing, cyanosis, or edema Skin- no rash or lesion Psych- euthymic mood, full affect Neuro- strength and sensation are intact  LABS: Basic Metabolic Panel:  Recent Labs  16/09/9609/24/15 0633  NA 144  K 3.5*  CL 107  CO2 25  GLUCOSE 132*  BUN 11  CREATININE 0.57  CALCIUM 8.9   Liver Function Tests: No results found for this basename: AST, ALT, ALKPHOS, BILITOT, PROT, ALBUMIN,  in the last 72 hours No results found for this basename: LIPASE, AMYLASE,  in the last 72  hours CBC:  Recent Labs  10/19/14 0633 10/20/14 0230  WBC 6.7 6.5  HGB 12.2 12.4  HCT 35.8* 36.6  MCV 80.6 83.8  PLT 167 137*   Cardiac Enzymes:  Recent Labs  10/18/14 1922 10/18/14 2335 10/19/14 0633  TROPONINI 1.02* 1.03* 2.02*   BNP: No components found with this basename: POCBNP,  D-Dimer: No results found for this basename: DDIMER,  in the last 72 hours Hemoglobin A1C:  Recent Labs  10/18/14 1922  HGBA1C 6.5*   Fasting Lipid Panel:  Recent Labs  10/19/14 0633  CHOL 188  HDL 47  LDLCALC 113*  TRIG 138  CHOLHDL 4.0   Thyroid Function Tests:  Recent Labs  10/18/14 1922  TSH 1.970   Anemia Panel: No results found for this basename: VITAMINB12, FOLATE, FERRITIN, TIBC, IRON, RETICCTPCT,  in the last 72 hours  RADIOLOGY: No results found.  ASSESSMENT AND PLAN:  Principal Problem:   NSTEMI (non-ST elevated myocardial infarction) Active Problems:   Diabetes  1. [place]/ NSTEMI The patient has modest troponin rise which is ongoing.  She has exertional CP but is CP free at rest.  I worry that she has an ustable plaque and is at risk for acute decompensation.  We will  continue very close observation and management.  She has had difficulty sleeping and would like to transfer to telemetry.  Continue  IV heparin, ASA, NTG paste. Increase metoprolol today. She is understanding of risks of cath and would like to proceed on Monday.  IF she decompensates then we will plan to proceed with cath sooner.  2. Hypertensive cardiovascular disease BP is very elevated this am Increase metoprolol  3. Diabetes with circulatory complications (CAD/ [place]) Hold gucophage for cath per protocol.   4. HL Lipids are reviewed Goal LDL <70 Statin initiated  5. Obesity Body mass index is 45.53 kg/(m^2). Weight reduction is advised  Hillis RangeAllred, Tyon Cerasoli, MD 10/20/2014 8:10 AM

## 2014-10-20 NOTE — Progress Notes (Signed)
ANTICOAGULATION CONSULT NOTE - Follow Up Consult  Pharmacy Consult for heparin Indication: chest pain/ACS/NSTEMI  Allergies  Allergen Reactions  . Sulfa Drugs Cross Reactors Swelling  . Penicillins Rash    Patient Measurements: Height: 5\' 4"  (162.6 cm) Weight: 265 lb 6.4 oz (120.385 kg) IBW/kg (Calculated) : 54.7 Heparin Dosing Weight: 84kg  Vital Signs: Temp: 97.8 F (36.6 C) (10/25 1021) Temp Source: Oral (10/25 1021) BP: 148/73 mmHg (10/25 1021) Pulse Rate: 73 (10/25 1021)  Labs:  Recent Labs  10/18/14 1922  10/18/14 2100 10/18/14 2335 10/19/14 0633 10/19/14 1335 10/20/14 0230 10/20/14 0930  HGB  --   --   --   --  12.2  --  12.4  --   HCT  --   --   --   --  35.8*  --  36.6  --   PLT  --   --   --   --  167  --  137*  --   HEPARINUNFRC  --   < > 0.13*  --  0.41 0.36 0.28* 0.40  CREATININE  --   --   --   --  0.57  --   --   --   TROPONINI 1.02*  --   --  1.03* 2.02*  --   --   --   < > = values in this interval not displayed.  Estimated Creatinine Clearance: 100.4 ml/min (by C-G formula based on Cr of 0.57).   Medications:  Infusions:  . heparin 1,600 Units/hr (10/20/14 16100339)    Assessment: 58 yo F admitted on 10/23 on IV heparin for NSTEMI. HL that was previously therapeutic dropped this morning to 0.28 and the rate was increased. 6hr heparin level is therapeutic at 0.40. Hgb/Hct WNL and stable. PLTC slight dropped to 137. Cath planned for Monday. No bleeding noted.  Goal of Therapy:  Heparin level 0.3-0.7 units/ml Monitor platelets by anticoagulation protocol: Yes   Plan:  Continue heparin gtt at 1600u/hr 6hr HL to confirm at 1600 Daily HL/CBC Monitor s/sx of bleeding  Thank you for allowing pharmacy to be part of this patient's care team  Jonette Wassel M. Morissa Obeirne, Pharm.D Clinical Pharmacy Resident Pager: [phone] 10/20/2014 .11:36 AM

## 2014-10-21 ENCOUNTER — Encounter (HOSPITAL_COMMUNITY)
Admission: AD | Disposition: A | Discharge status: Home / Self Care | Payer: 59 | Source: Ambulatory Visit | Attending: Cardiology

## 2014-10-21 DIAGNOSIS — I25110 Atherosclerotic heart disease of native coronary artery with unstable angina pectoris: Secondary | ICD-10-CM | POA: Diagnosis present

## 2014-10-21 DIAGNOSIS — E782 Mixed hyperlipidemia: Secondary | ICD-10-CM | POA: Diagnosis present

## 2014-10-21 DIAGNOSIS — I10 Essential (primary) hypertension: Secondary | ICD-10-CM | POA: Diagnosis present

## 2014-10-21 DIAGNOSIS — I2510 Atherosclerotic heart disease of native coronary artery without angina pectoris: Secondary | ICD-10-CM

## 2014-10-21 HISTORY — PX: LEFT HEART CATHETERIZATION WITH CORONARY ANGIOGRAM: SHX5451

## 2014-10-21 LAB — CBC
HEMATOCRIT: 41.1 % (ref 36.0–46.0)
Hemoglobin: 14.2 g/dL (ref 12.0–15.0)
MCH: 28.3 pg (ref 26.0–34.0)
MCHC: 34.5 g/dL (ref 30.0–36.0)
MCV: 82 fL (ref 78.0–100.0)
Platelets: 154 10*3/uL (ref 150–400)
RBC: 5.01 MIL/uL (ref 3.87–5.11)
RDW: 14 % (ref 11.5–15.5)
WBC: 10.4 10*3/uL (ref 4.0–10.5)

## 2014-10-21 LAB — GLUCOSE, CAPILLARY
GLUCOSE-CAPILLARY: 146 mg/dL — AB (ref 70–99)
GLUCOSE-CAPILLARY: 193 mg/dL — AB (ref 70–99)
Glucose-Capillary: 263 mg/dL — ABNORMAL HIGH (ref 70–99)
Glucose-Capillary: 262 mg/dL — ABNORMAL HIGH (ref 70–99)

## 2014-10-21 LAB — CK TOTAL AND CKMB (NOT AT ARMC)
CK TOTAL: 207 U/L — AB (ref 7–177)
CK, MB: 16.5 ng/mL — AB (ref 0.3–4.0)
Relative Index: 8 — ABNORMAL HIGH (ref 0.0–2.5)

## 2014-10-21 LAB — HEPARIN LEVEL (UNFRACTIONATED): Heparin Unfractionated: 0.38 IU/mL (ref 0.30–0.70)

## 2014-10-21 LAB — POCT ACTIVATED CLOTTING TIME: Activated Clotting Time: 484 seconds

## 2014-10-21 SURGERY — LEFT HEART CATHETERIZATION WITH CORONARY ANGIOGRAM
Anesthesia: LOCAL

## 2014-10-21 MED ORDER — ONDANSETRON HCL 4 MG/2ML IJ SOLN
INTRAMUSCULAR | Status: AC
  Filled 2014-10-21: qty 2

## 2014-10-21 MED ORDER — TICAGRELOR 90 MG PO TABS
90.0000 mg | ORAL_TABLET | Freq: Two times a day (BID) | ORAL | Status: DC
  Filled 2014-10-21 (×2): qty 1

## 2014-10-21 MED ORDER — SODIUM CHLORIDE 0.9 % IV SOLN
1.0000 mL/kg/h | INTRAVENOUS | Status: AC
  Administered 2014-10-21: 15:00:00 1 mL/kg/h via INTRAVENOUS

## 2014-10-21 MED ORDER — DIPHENHYDRAMINE HCL 25 MG PO CAPS: 25.0000 mg | ORAL_CAPSULE | Freq: Every day | ORAL | Status: DC

## 2014-10-21 MED ORDER — ACETAMINOPHEN 325 MG PO TABS: 650.0000 mg | ORAL_TABLET | ORAL | Status: DC | PRN

## 2014-10-21 MED ORDER — METHYLPREDNISOLONE SODIUM SUCC 125 MG IJ SOLR
125.0000 mg | INTRAMUSCULAR | Status: AC
  Administered 2014-10-21: 125 mg via INTRAVENOUS
  Filled 2014-10-21: qty 2

## 2014-10-21 MED ORDER — FENTANYL CITRATE 0.05 MG/ML IJ SOLN
INTRAMUSCULAR | Status: AC
  Filled 2014-10-21: qty 2

## 2014-10-21 MED ORDER — HEPARIN (PORCINE) IN NACL 2-0.9 UNIT/ML-% IJ SOLN
INTRAMUSCULAR | Status: AC
  Filled 2014-10-21: qty 1500

## 2014-10-21 MED ORDER — BIVALIRUDIN 250 MG IV SOLR
INTRAVENOUS | Status: AC
  Filled 2014-10-21: qty 250

## 2014-10-21 MED ORDER — METOPROLOL TARTRATE 50 MG PO TABS
75.0000 mg | ORAL_TABLET | Freq: Two times a day (BID) | ORAL | Status: DC
  Administered 2014-10-21 – 2014-10-22 (×3): 75 mg via ORAL
  Filled 2014-10-21 (×6): qty 1

## 2014-10-21 MED ORDER — NITROGLYCERIN 1 MG/10 ML FOR IR/CATH LAB
INTRA_ARTERIAL | Status: AC
  Filled 2014-10-21: qty 10

## 2014-10-21 MED ORDER — FAMOTIDINE IN NACL 20-0.9 MG/50ML-% IV SOLN
20.0000 mg | INTRAVENOUS | Status: AC
  Administered 2014-10-21: 20 mg via INTRAVENOUS
  Filled 2014-10-21: qty 50

## 2014-10-21 MED ORDER — ALPRAZOLAM 0.5 MG PO TABS: 1.0000 mg | ORAL_TABLET | Freq: Two times a day (BID) | ORAL | Status: DC | PRN

## 2014-10-21 MED ORDER — INSULIN ASPART 100 UNIT/ML ~~LOC~~ SOLN
0.0000 [IU] | Freq: Every day | SUBCUTANEOUS | Status: DC
  Administered 2014-10-21: 3 [IU] via SUBCUTANEOUS

## 2014-10-21 MED ORDER — DIPHENHYDRAMINE HCL 25 MG PO CAPS: 25.0000 mg | ORAL_CAPSULE | Freq: Every evening | ORAL | Status: DC | PRN

## 2014-10-21 MED ORDER — OXYCODONE-ACETAMINOPHEN 5-325 MG PO TABS: 1.0000 | ORAL_TABLET | ORAL | Status: DC | PRN

## 2014-10-21 MED ORDER — ZOLPIDEM TARTRATE 5 MG PO TABS: 5.0000 mg | ORAL_TABLET | Freq: Every evening | ORAL | Status: DC | PRN

## 2014-10-21 MED ORDER — HEPARIN SODIUM (PORCINE) 1000 UNIT/ML IJ SOLN
INTRAMUSCULAR | Status: AC
  Filled 2014-10-21: qty 1

## 2014-10-21 MED ORDER — METOPROLOL TARTRATE 1 MG/ML IV SOLN
INTRAVENOUS | Status: AC
  Filled 2014-10-21: qty 5

## 2014-10-21 MED ORDER — ONDANSETRON HCL 4 MG/2ML IJ SOLN: 4.0000 mg | Freq: Four times a day (QID) | INTRAMUSCULAR | Status: DC | PRN

## 2014-10-21 MED ORDER — DIPHENHYDRAMINE-APAP (SLEEP) 25-500 MG PO TABS: 1.0000 | ORAL_TABLET | Freq: Every evening | ORAL | Status: DC | PRN

## 2014-10-21 MED ORDER — DIPHENHYDRAMINE HCL 25 MG PO CAPS
25.0000 mg | ORAL_CAPSULE | Freq: Every day | ORAL | Status: DC
  Administered 2014-10-21: 25 mg via ORAL
  Filled 2014-10-21 (×2): qty 1

## 2014-10-21 MED ORDER — VERAPAMIL HCL 2.5 MG/ML IV SOLN
INTRAVENOUS | Status: AC
  Filled 2014-10-21: qty 2

## 2014-10-21 MED ORDER — LIDOCAINE HCL (PF) 1 % IJ SOLN
INTRAMUSCULAR | Status: AC
  Filled 2014-10-21: qty 30

## 2014-10-21 MED ORDER — ACETAMINOPHEN 500 MG PO TABS: 500.0000 mg | ORAL_TABLET | Freq: Every evening | ORAL | Status: DC | PRN

## 2014-10-21 MED ORDER — PRASUGREL HCL 10 MG PO TABS
ORAL_TABLET | ORAL | Status: AC
  Filled 2014-10-21: qty 6

## 2014-10-21 MED ORDER — ENOXAPARIN SODIUM 40 MG/0.4ML ~~LOC~~ SOLN
40.0000 mg | SUBCUTANEOUS | Status: DC
Start: 2014-10-22 — End: 2014-10-22
  Administered 2014-10-22: 40 mg via SUBCUTANEOUS
  Filled 2014-10-21 (×2): qty 0.4

## 2014-10-21 MED ORDER — PRASUGREL HCL 10 MG PO TABS
10.0000 mg | ORAL_TABLET | Freq: Every day | ORAL | Status: DC
  Administered 2014-10-22: 09:00:00 10 mg via ORAL
  Filled 2014-10-21: qty 1

## 2014-10-21 MED ORDER — MIDAZOLAM HCL 2 MG/2ML IJ SOLN
INTRAMUSCULAR | Status: AC
  Filled 2014-10-21: qty 2

## 2014-10-21 MED ORDER — SIMETHICONE 40 MG/0.6ML PO SUSP
40.0000 mg | Freq: Four times a day (QID) | ORAL | Status: DC | PRN
  Administered 2014-10-21 (×2): 40 mg via ORAL
  Filled 2014-10-21 (×3): qty 0.6

## 2014-10-21 MED ORDER — METFORMIN HCL 500 MG PO TABS: 500.0000 mg | ORAL_TABLET | Freq: Two times a day (BID) | ORAL | Status: DC

## 2014-10-21 MED ORDER — DIPHENHYDRAMINE HCL 50 MG/ML IJ SOLN
25.0000 mg | INTRAMUSCULAR | Status: AC
  Administered 2014-10-21: 25 mg via INTRAVENOUS
  Filled 2014-10-21: qty 1

## 2014-10-21 MED ORDER — DIPHENHYDRAMINE HCL (SLEEP) 25 MG PO TABS: 25.0000 mg | ORAL_TABLET | Freq: Every day | ORAL | Status: DC

## 2014-10-21 MED ORDER — INSULIN ASPART 100 UNIT/ML ~~LOC~~ SOLN
0.0000 [IU] | Freq: Three times a day (TID) | SUBCUTANEOUS | Status: DC
  Administered 2014-10-21: 12:00:00 3 [IU] via SUBCUTANEOUS
  Administered 2014-10-21: 8 [IU] via SUBCUTANEOUS
  Administered 2014-10-22: 08:00:00 3 [IU] via SUBCUTANEOUS

## 2014-10-21 MED ORDER — HEART ATTACK BOUNCING BOOK
Freq: Once | Status: AC
  Administered 2014-10-22
  Filled 2014-10-21: qty 1

## 2014-10-21 MED ORDER — NITROGLYCERIN 1 MG/10 ML FOR IR/CATH LAB
INTRA_ARTERIAL | Status: AC
Start: 2014-10-21 — End: 2014-10-21
  Filled 2014-10-21: qty 10

## 2014-10-21 NOTE — Progress Notes (Signed)
UR completed Irja Wheless K. Lyndsey Demos, RN, BSN, MSHL, CCM  10/21/2014 2:12 PM

## 2014-10-21 NOTE — Plan of Care (Signed)
Problem: Consults Goal: MI Patient Education (See Patient Education module for education specifics.)  Outcome: Progressing Post radial cath instructions given and reviewed w/ pt.  Discussed plan of care, meds, and SL NTG use in emergency.  "Bouncing back from heart attack" book given.  Discussed CBG elevated >200  due to solumedrol and reason for holding metformin x 48 hrs post cath, pt voiced understanding.  SS insulin given.  Pt denies complaints.  Rt radial level 0.

## 2014-10-21 NOTE — Care Management Note (Addendum)
  Page 1 of 1   10/21/2014     2:44:33 PM CARE MANAGEMENT NOTE 10/21/2014  Patient:  Andrea Hughes,Andrea Hughes   Account Number:  [id number]  Date Initiated:  10/21/2014  Documentation initiated by:  Donato SchultzHUTCHINSON,Callie Bunyard  Subjective/Objective Assessment:   CP     Action/Plan:   CM to follow for disposition needs   Anticipated DC Date:  10/22/2014   Anticipated DC Plan:  HOME/SELF CARE  In-house referral  NA      DC Planning Services  CM consult  Medication Assistance      Choice offered to / List presented to:  NA           Status of service:  Completed, signed off Medicare Important Message given?   (If response is "NO", the following Medicare IM given date fields will be blank) Date Medicare IM given:   Medicare IM given by:   Date Additional Medicare IM given:   Additional Medicare IM given by:    Discharge Disposition:  HOME/SELF CARE  Per UR Regulation:  Reviewed for med. necessity/level of care/duration of stay  If discussed at Long Length of Stay Meetings, dates discussed:    Comments:  Rhealynn Myhre RN, BSN, MSHL, CCM  Nurse - Case Manager,  (Unit [phone])  [phone]  10/21/2014 Med Review:  prasugrel (EFFIENT) tablet 10 mg po qd - per rep at [facility]:  medication is covered/ $60 30day retail/ $120 90day mail order Dispo Plan:  Home Self care.

## 2014-10-21 NOTE — Progress Notes (Signed)
Patient Name: Andrea Hughes Date of Encounter: 10/21/2014   Principal Problem:   NSTEMI (non-ST elevated myocardial infarction) Active Problems:   Diabetes   Hyperlipidemia   Hypertension    SUBJECTIVE  Andrea Hughes reports to be feeling "ok"; denies chest pain and shortness of breath overnight and this morning.  She reports new onset of left upper quadrant abdominal pain that began last night which she attributes to gas.  She was given Mylanta and Simethicone which helped decrease pain and release gas. Denies nausea and vomiting; last bowel movement 10/20/14.  CURRENT MEDS . aspirin EC  81 mg Oral Daily  . atorvastatin  80 mg Oral q1800  . diphenhydrAMINE  25 mg Intravenous Pre-Cath  . famotidine (PEPCID) IV  20 mg Intravenous Pre-Cath  . losartan  100 mg Oral Daily  . metFORMIN  500 mg Oral BID WC  . methylPREDNISolone (SOLU-MEDROL) injection  125 mg Intravenous Pre-Cath  . metoprolol tartrate  50 mg Oral BID  . nitroGLYCERIN  1 inch Topical 4 times per day  . sodium chloride  3 mL Intravenous Q12H    OBJECTIVE  Filed Vitals:   10/20/14 1500 10/20/14 2126 10/21/14 0400 10/21/14 0536  BP: 159/80 143/70  154/74  Pulse: 73 76  75  Temp: 98.5 F (36.9 C) 98.3 F (36.8 C)  98.5 F (36.9 C)  TempSrc: Oral Oral  Oral  Resp: 16 18  18   Height:      Weight:   267 lb 11.2 oz (121.428 kg)   SpO2: 100% 100%  100%    Intake/Output Summary (Last 24 hours) at 10/21/14 0806 Last data filed at 10/20/14 1800  Gross per 24 hour  Intake    600 ml  Output      0 ml  Net    600 ml   Filed Weights   10/18/14 1500 10/20/14 0300 10/21/14 0400  Weight: 264 lb 15.9 oz (120.2 kg) 265 lb 6.4 oz (120.385 kg) 267 lb 11.2 oz (121.428 kg)    PHYSICAL EXAM  General: Pleasant, NAD. Neuro: Alert and oriented X 3. Moves all extremities spontaneously. Psych: Normal affect. HEENT:  Normal  Neck: Supple without bruits or JVD. Lungs:  Resp regular and unlabored, CTA. Heart: RRR no  s3, s4, or murmurs. Abdomen: Soft, non-tender, non-distended, BS + x 4.  Extremities: No clubbing, cyanosis or edema. DP/PT/Radials 2+ and equal bilaterally.  Accessory Clinical Findings  CBC  Recent Labs  10/20/14 0230 10/21/14 0324  WBC 6.5 10.4  HGB 12.4 14.2  HCT 36.6 41.1  MCV 83.8 82.0  PLT 137* 154   Basic Metabolic Panel  Recent Labs  10/19/14 0633  NA 144  K 3.5*  CL 107  CO2 25  GLUCOSE 132*  BUN 11  CREATININE 0.57  CALCIUM 8.9   Cardiac Enzymes  Recent Labs  10/18/14 1922 10/18/14 2335 10/19/14 0633  TROPONINI 1.02* 1.03* 2.02*   Hemoglobin A1C  Recent Labs  10/18/14 1922  HGBA1C 6.5*   Fasting Lipid Panel  Recent Labs  10/19/14 0633  CHOL 188  HDL 47  LDLCALC 113*  TRIG 138  CHOLHDL 4.0   Thyroid Function Tests  Recent Labs  10/18/14 1922  TSH 1.970    TELE  Normal Sinus Rhythm  Radiology/Studies  No results found.  ASSESSMENT AND PLAN  1. Unstable Angina/NSTEMI: No chest pain at rest or during ambulation to bathroom; however has had exertional chest pain and shortness of breath during hospitalization. Proceed with  cardiac catheterization this morning. Continue IV Heparin, asa, statin, bb.  2. Hypertension: BP improving though still moderately elevated.  Will push Metoprolol to 75mg  bid.  Cont ARB.   3. Diabetes: Blood glucose stable. Metformin to be withheld due to procedure.  Cont SSI.  A1c = 6.5.  4. Hyperlipidemia: LDL above goal (113), continue atrovastatin 80.  5. Obesity: Overweight. Importance of healthy diet discussed. Encouraged cardiac rehab post catheterization.   Signed, Nicolasa Duckinghristopher Jeramy Dimmick NP

## 2014-10-21 NOTE — Interval H&P Note (Signed)
History and Physical Interval Note:  10/21/2014 9:32 AM  Andrea Hughes  has presented today for surgery, with the diagnosis of NSTEMI.  The various methods of treatment have been discussed with the patient and family. After consideration of risks, benefits and other options for treatment, the patient has consented to  Procedure(s): LEFT HEART CATHETERIZATION WITH CORONARY ANGIOGRAM (N/A) +/- as a surgical intervention .  The patient's history has been reviewed, patient examined, no change in status, stable for surgery.  I have reviewed the patient's chart and labs.   Risks / Complications include, but not limited to: Death, MI, CVA/TIA, VF/VT (with defibrillation), Bradycardia (need for temporary pacer placement), contrast induced nephropathy, bleeding / bruising / hematoma / pseudoaneurysm, vascular or coronary injury (with possible emergent CT or Vascular Surgery), adverse medication reactions, infection.    The patient voiceS understanding and agree to proceed.     Questions were answered to the patient's satisfaction.    Cath Lab Visit (complete for each Cath Lab visit)  Clinical Evaluation Leading to the Procedure:   ACS: Yes.    Non-ACS:    Anginal Classification: CCS IV  Anti-ischemic medical therapy: Minimal Therapy (1 class of medications)  Non-Invasive Test Results: No non-invasive testing performed  Prior CABG: No previous CABG  Andrea Hughes W

## 2014-10-21 NOTE — CV Procedure (Signed)
 CARDIAC CATHETERIZATION  AND PERCUTANEOUS CORONARY INTERVENTION REPORT  NAME:  Andrea Hughes   MRN: [id number] DOB:  12/08/1956   ADMIT DATE: 10/18/2014 Procedure Date: 10/21/2014  INTERVENTIONAL CARDIOLOGIST: David W Harding, M.D., MS PRIMARY CARE PROVIDER: No PCP Per Patient PRIMARY CARDIOLOGIST:  New to [facility] --  Initially seen by Dr. Hochrein, will be followed up at [place] office.   PATIENT:  Andrea Hughes is a 58 y.o. female with obesity, diabetes , hypertension and hyperlipidemia who presented with a non-ST elevation MI  On the 23rd of October. Symptoms began on the 22nd as 5 out of 10 substernal heaviness.  She initially presented to [facility] and was transferred to [place] for EKG changes and troponin positive.  Unfortunately she arrived too late to undergo cardiac catheterization and PCI on Friday and was maintained in the hospital on IV heparin pending cardiac catheterization today.  PRE-OPERATIVE DIAGNOSIS:   Principal Problem:   NSTEMI (non-ST elevated myocardial infarction) Active Problems:   Diabetes   Hyperlipidemia   Hypertension  PROCEDURES PERFORMED:    Left Heart Catheterization with Native Coronary Angiography  via  Right Radial Artery   Left Ventriculography  PERCUTANEOUS CORONARY INTERVENTION OF MID LAD - XIENCE ALPINE DES 3.0 MM X 18 MM ( 3.24 mm distal, 3 6 mm proximal)  PROCEDURE: The patient was brought to the 2nd Floor [facility] in the fasting state and prepped and draped in the usual sterile fashion for  Right radial artery access. A modified Allen's test was performed on the  right wrist demonstrating excellent collateral flow for radial access.   Sterile technique was used including antiseptics, cap, gloves, gown, hand hygiene, mask and sheet. Skin prep: Chlorhexidine.   Consent: Risks of procedure as well as the alternatives and risks of each were explained to the (patient/caregiver).  Consent for procedure obtained.   Time Out: Verified patient identification, verified procedure, site/side was marked, verified correct patient position, special equipment/implants available, medications/allergies/relevent history reviewed, required imaging and test results available. Performed.  Access:    RIGHT RADIAL Artery:  6 Fr Sheath -  Seldinger Technique (Angiocath Micropuncture Kit)  Radial Cocktail - 10 mL; IV Heparin  5000 Units   Left Heart Catheterization:  5Fr Catheters advanced or exchanged over a  Long exchange safety J-wire; TIG 4.0 catheter advanced first.  Left & Right Coronary Artery Cineangiography: TIG 4.0 Catheter   LV Hemodynamics (LV Gram): Angled Pigtail Catheter  Sheath removed in the  Cardiac catheterization lab with VASC Band applied for hemostasis.  VASC Band: 1035  Hours; 14 mL air  FINDINGS:  Hemodynamics:   Central Aortic Pressure / Mean:  130/79/101 mmHg  Left Ventricular Pressure / LVEDP:  130/6/12 mmHg  Left Ventriculography:  EF:  50-55 %  Wall Motion:  POSSIBLE MILD ANTERIOR HYPOKINESIS ( POOR IMAGING )  Coronary Anatomy:  Dominance:  RIGHT  Left Main:  Normal caliber vessel that bifurcates into the LAD and almost codominant circumflex. LAD:  Large-caliber vessel with 2 proximal diagonal branches there are small in diameter. Following the third slightly larger (smaller to moderate caliber) D3, there is a focal thrombotic 99% subtotal occlusion. Beyond this lesion the vessel then ormalizes and reached down around the inferoapex. There is minimal involvement with maybe 20% stenosis of the D3 ostium..  Left Circumflex:  Large caliber vessel that promptly bifurcates into a large lateral ramus-like  OM that bifurcates in the mid vessel with both branches reaching almost   out of the inferolateral  Wall/apex. The AV groove circumflex continues distally in bifurcates into 2 branches 1 is probably a second OM last branch is a LPL. Minimal luminal  irregularities   RCA:  Large caliber, dominant vessel that bifurcates distally into the right posterior descending artery (RPDA )  Right Posterior AV Groove Branch (RPAV).  Minimal luminal irregularities. Several large marginal branches and the mid vessel.  RPDA:  Large-caliber vessel reaches almost all the way to the apex. Somewhat tortuous but free of disease.  RPL Sysytem:The RPAV  Begins as a moderate large-caliber vessel bifurcates into  RPL 1 and 2 as well as giving off the AV nodal artery.  Minimal luminal irregularities  After reviewing the initial angiography, the culprit lesion was thought to be  99% subtotal occlusion of the mid LAD.  Preparation were made to proceed with PCI on this lesion. Because this was just distal to the third diagonal branch, it was initially felt to be prudent to place a wire into the branch vessel as well as down the parent vessel for PCI.  Percutaneous Coronary Intervention:  Sheath exchanged for 6 Fr Guide: 6 Fr    XB LAD 3.5 Guidewire: BMW wire for D3, ProWater for LAD  LESION:  99% thrombotic occlusion of mid LAD with TIMI-3 flow pre-and post. Reduced to 0%  Predilation: Emerge 2.5 mm x 12 mm --> 10 Atm x 30 Sec   Stent: XIENCE ALPINE DES 3.0 MM X 18 MM (jails D3) --> 16 Atm x 45 Sec (distal diameter ~3.24 mm)   Post-dilation: [place] Trek 3.5 mm x 12 mm --> 16 Atm x 45 Atm  Post deployment angiography in multiple views, with and without guidewire in place revealed excellent stent deployment and lesion coverage.  There was no evidence of dissection or perforation.  MEDICATIONS:  Anesthesia:  Local Lidocaine  2 ml  Sedation:   2 mg IV Versed,  50 mcg IV fentanyl ;   Premedication:   IV - 125 g Solu-Medrol, 20 mg Pepcid, 25 mg Benadryl for reported contrast allergy.  Omnipaque Contrast:  170 ml  Anticoagulation:  IV Heparin  5000 Units ; Angiomax Bolus & drip  Anti-Platelet Agent:   Effient 10 mg daily Radial Cocktail: 5 mg Verapamil, 400 mcg  NTG, 2 ml 2% Lidocaine in 10 ml NS IC NTG 200 mcg x 1 IV Zofran (for post-sedation nausea) IV Lopressor 5 mg  PATIENT DISPOSITION:    The patient was transferred to the PACU holding area in a hemodynamicaly stable, chest pain free condition.  The patient tolerated the procedure well, and there were no complications.  EBL:   <  10 ml  The patient was stable before, during, and after the procedure.  POST-OPERATIVE DIAGNOSIS:    Severe single-vessel disease involving the mid  LAD 9% subtotal occlusion successfully treated with a single Xience Alpine DES.  Low normal LVEF with mildly elevated LVEDP  Difficult to assess LV function due to poor LV Gram  PLAN OF CARE:  Patient is transferred to 6C postprocedure unit for standard post radial cath care.  Anticipate discharge in the morning  Dual antiplatelet therapy for minimum of one year.  Aggressive cardiac risk factor modification.  The patient is probably best served following up in either the [place] or [place]  Office.    HARDING,DAVID W, M.D., M.S. Interventional Cardiologist   Pager # [phone]    

## 2014-10-21 NOTE — H&P (View-Only) (Signed)
CC: acute MI  SUBJECTIVE: The patient is doing reasonably well today.  She continues to have SOB with exertion.  At this time, she denies chest pain at rest, shortness of breath, or any new concerns.  Marland Kitchen. aspirin EC  81 mg Oral Daily  . atorvastatin  80 mg Oral q1800  . losartan  100 mg Oral Daily  . metFORMIN  500 mg Oral BID WC  . metoprolol tartrate  12.5 mg Oral BID  . nitroGLYCERIN  1 inch Topical 4 times per day  . sodium chloride  3 mL Intravenous Q12H   . heparin 1,500 Units/hr (10/19/14 0446)    OBJECTIVE: Physical Exam: Filed Vitals:   10/19/14 0300 10/19/14 0330 10/19/14 0400 10/19/14 0804  BP: 138/64 138/64  147/62  Pulse: 69 75 78   Temp: 97.9 F (36.6 C)   97.7 F (36.5 C)  TempSrc: Oral   Oral  Resp: 13 16 19 14   Height:      Weight:      SpO2: 96% 96% 100% 100%    Intake/Output Summary (Last 24 hours) at 10/19/14 0915 Last data filed at 10/19/14 0850  Gross per 24 hour  Intake  963.5 ml  Output    825 ml  Net  138.5 ml    Telemetry reveals sinus rhythm  GEN- The patient is overweight appearing, alert and oriented x 3 today.   Head- normocephalic, atraumatic Eyes-  Sclera clear, conjunctiva pink Ears- hearing intact Oropharynx- clear Neck- supple,  Lungs- Clear to ausculation bilaterally, normal work of breathing Heart- Regular rate and rhythm, no murmurs, rubs or gallops, PMI not laterally displaced GI- soft, NT, ND, + BS Extremities- no clubbing, cyanosis, or edema Skin- no rash or lesion Psych- euthymic mood, full affect Neuro- strength and sensation are intact  LABS: Basic Metabolic Panel:  Recent Labs  19/14/7810/24/15 0633  NA 144  K 3.5*  CL 107  CO2 25  GLUCOSE 132*  BUN 11  CREATININE 0.57  CALCIUM 8.9   Liver Function Tests: No results found for this basename: AST, ALT, ALKPHOS, BILITOT, PROT, ALBUMIN,  in the last 72 hours No results found for this basename: LIPASE, AMYLASE,  in the last 72 hours CBC:  Recent Labs  10/19/14 0633  WBC 6.7  HGB 12.2  HCT 35.8*  MCV 80.6  PLT 167   Cardiac Enzymes:  Recent Labs  10/18/14 1922 10/18/14 2335 10/19/14 0633  TROPONINI 1.02* 1.03* 2.02*   BNP: No components found with this basename: POCBNP,  D-Dimer: No results found for this basename: DDIMER,  in the last 72 hours Hemoglobin A1C:  Recent Labs  10/18/14 1922  HGBA1C 6.5*   Fasting Lipid Panel:  Recent Labs  10/19/14 0633  CHOL 188  HDL 47  LDLCALC 113*  TRIG 138  CHOLHDL 4.0   Thyroid Function Tests:  Recent Labs  10/18/14 1922  TSH 1.970   Anemia Panel: No results found for this basename: VITAMINB12, FOLATE, FERRITIN, TIBC, IRON, RETICCTPCT,  in the last 72 hours  RADIOLOGY: No results found.  ASSESSMENT AND PLAN:  Principal Problem:   NSTEMI (non-ST elevated myocardial infarction) Active Problems:   Diabetes  1. USA/ NSTEMI The patient has modest troponin rise which is ongoing.  She has exertional CP but is CP free at rest.  I worry that she has an ustable plaque and is at risk for acute decompensation.  I will therefore keep her in the TCU today for very close observation and management.  Continue  IV heparin, ASA, NTG paste. Low dose beta blocker _-> titrate as above.  She is understanding of risks of cath and would like to proceed on Monday.  IF she decompensates then we will plan to proceed with cath sooner.  2. Hypertensive cardiovascular disease Resume losartan today  3. Diabetes with circulatory complications (CAD/ USA) For now I will continue Glucophage. Hold for cath per protocol.   4. HL Lipids are reviewed Goal LDL <70 Statin initiated  5. Obesity Body mass index is 45.46 kg/(m^2). Weight reduction is advised  Iara Monds, MD 10/19/2014 9:15 AM  

## 2014-10-21 NOTE — Brief Op Note (Signed)
  BRIEF CARDIAC CATH - PCI NOTE  10/18/2014 - 10/21/2014  10:39 AM  PATIENT:  Andrea Hughes  58 y.o. female with PMH of HTN, obesity & DM-2 (controlled) who presented on 10/18/2014 with NSTEMI. Referred for Cardiac Cath - PCI.  PRE-OPERATIVE DIAGNOSIS:   NSTEMI  POST-OPERATIVE DIAGNOSIS:    CULPRIT -Focal 99% MID LAD (just after D3 & large SP trunk) -- SUCCESSFUL PCI WITH XIENCE ALPINE DES 3.0 MM X 18 MM (3.5 MM PROX & 3.24 MM DISTAL) - jails small D3  Remainder of LAD & 3 Diagonals angiographically normal, LAD wraps inferoapex  Dominant RCA with RPDA & 1 major RPL, Non-dominant Circumflex gives off HIGH OM /Ramus that bifurcates in mid vessel, distal Circumflex bifurcates into OM2 & LPL1.  Likley preserved LVEF (poor LV gram image)  Hemodynamics: EF ~ 55%, but unable to assess wall motion.  AoP: 130/79/101 mmHg (but final BP ~170/80 mmHg)  LVP: 130/6/16 mmHg  PROCEDURE:  Procedure(s):  LEFT HEART CATHETERIZATION WITH CORONARY ANGIOGRAM (N/A)  R Radial 6 Fr Sheath -- 5 Fr TIG 4.0 Diagnostic Cath for R&L CA Angioraphy. Angled Pigtail - LV Gram  PERCUTANEOUS CORONARY INTERVENTION OF MID LAD - XIENCE ALPINE DES  6 Fr XB LAD guide - BMW wire for D3, ProWater for LAD  Predilation: Emerge 2.5 mm x 12 mm --> 10 Atm x 30 Sec  Stent: XIENCE ALPINE DES 3.0 MM X 18 MM (jails D3) --> 16 Atm x 45 Sec (distal diameter ~3.24 mm)  Post-dilation: [place] Trek 3.5 mm x 12 mm --> 16 Atm x 45 Atm  SURGEON:  Surgeon(s) and Role:    * Leonie Man, MD - Primary  MEDICATIONS:   local, IV Versed $RemoveB'2mg'FBdGchnH$  , Fentanyl 50 mcg; Radial Cocktail: 5 mg Verapamil, 400 mcg NTG, 2 ml 2% Lidocaine in 10 ml NS; IV Zofran $RemoveB'4mg'WzgHIHJI$ , IV Lopressor 5 mg, Angiomax Bolux & gtt, PO Effient 60 mg,   Premeds: 125 mg Solumedrol, 20 mg Pepcid, 25 mg Benadryl IV  EBL:   < 10 ml   VASC Band:  14 mL, 1035 hrs.  DICTATION: .Note written in EPIC  PLAN OF CARE: Transfer to 6 C; continue aggressive RF management, DAPT x  minimum of  yr.  F/u in [place] office.  PATIENT DISPOSITION:  PACU - hemodynamically stable.   Leonie Man, M.D., M.S. Interventional Cardiologist   Pager # [phone]

## 2014-10-22 ENCOUNTER — Encounter (HOSPITAL_COMMUNITY): Payer: Self-pay | Admitting: Physician Assistant

## 2014-10-22 ENCOUNTER — Telehealth: Payer: Self-pay | Admitting: Cardiology

## 2014-10-22 LAB — BASIC METABOLIC PANEL
Anion gap: 14 (ref 5–15)
BUN: 11 mg/dL (ref 6–23)
CALCIUM: 9.1 mg/dL (ref 8.4–10.5)
CO2: 21 mEq/L (ref 19–32)
CREATININE: 0.58 mg/dL (ref 0.50–1.10)
Chloride: 109 mEq/L (ref 96–112)
GFR calc non Af Amer: >90 mL/min (ref >90)
Glucose, Bld: 186 mg/dL — ABNORMAL HIGH (ref 70–99)
Potassium: 3.8 mEq/L (ref 3.7–5.3)
Sodium: 144 mEq/L (ref 137–147)

## 2014-10-22 LAB — CBC
HEMATOCRIT: 35.3 % — AB (ref 36.0–46.0)
HEMOGLOBIN: 11.8 g/dL — AB (ref 12.0–15.0)
MCH: 27.4 pg (ref 26.0–34.0)
MCHC: 33.4 g/dL (ref 30.0–36.0)
MCV: 82.1 fL (ref 78.0–100.0)
Platelets: 175 10*3/uL (ref 150–400)
RBC: 4.3 MIL/uL (ref 3.87–5.11)
RDW: 13.8 % (ref 11.5–15.5)
WBC: 11.7 10*3/uL — ABNORMAL HIGH (ref 4.0–10.5)

## 2014-10-22 LAB — GLUCOSE, CAPILLARY
GLUCOSE-CAPILLARY: 158 mg/dL — AB (ref 70–99)
GLUCOSE-CAPILLARY: 132 mg/dL — AB (ref 70–99)

## 2014-10-22 MED ORDER — ASPIRIN 81 MG PO TBEC: 81.0000 mg | DELAYED_RELEASE_TABLET | Freq: Every day | ORAL | Status: DC

## 2014-10-22 MED ORDER — PRASUGREL HCL 10 MG PO TABS: 10.0000 mg | ORAL_TABLET | Freq: Every day | ORAL | Status: DC

## 2014-10-22 MED ORDER — METFORMIN HCL 1000 MG PO TABS: 2000.0000 mg | ORAL_TABLET | Freq: Every day | ORAL | Status: DC

## 2014-10-22 MED ORDER — METFORMIN HCL ER 500 MG PO TB24: 2000.0000 mg | ORAL_TABLET | Freq: Every day | ORAL | Status: DC

## 2014-10-22 MED ORDER — NITROGLYCERIN 0.4 MG SL SUBL: 0.4000 mg | SUBLINGUAL_TABLET | SUBLINGUAL | Status: DC | PRN

## 2014-10-22 MED ORDER — ATORVASTATIN CALCIUM 80 MG PO TABS: 80.0000 mg | ORAL_TABLET | Freq: Every day | ORAL | Status: DC

## 2014-10-22 MED ORDER — METOPROLOL TARTRATE 25 MG PO TABS: 75.0000 mg | ORAL_TABLET | Freq: Two times a day (BID) | ORAL | Status: DC

## 2014-10-22 MED FILL — Sodium Chloride IV Soln 0.9%: INTRAVENOUS | Qty: 50 | Status: AC

## 2014-10-22 NOTE — Progress Notes (Signed)
CARDIAC REHAB PHASE I   PRE:  Rate/Rhythm: 72 SR  BP:  Supine:   Sitting: 131/69  Standing:    SaO2: 100%RA  MODE:  Ambulation: 440 ft   POST:  Rate/Rhythm: 90 SR  BP:  Supine:   Sitting: 151/66  Standing:    SaO2:  [id number] Pt walked 440 ft with steady gait. Hips a little sore with walk but no CP. Tolerated well. MI education completed with pt and husband who voiced understanding. Discussed carb counting and heart healthy choices. Discussed CRP 2 and permission given to [facility]. Has effient and stent booklets. Reviewed NTG use.   Luetta Nuttingharlene Kaston Faughn, RN BSN  10/22/2014 8:43 AM

## 2014-10-22 NOTE — Discharge Instructions (Signed)
You can resume your metformin 10/24/14 which is >48 hours post contrast dye exposure with your heart cath. This will prevent kidney injury   Radial Site Care Refer to this sheet in the next few weeks. These instructions provide you with information on caring for yourself after your procedure. Your caregiver may also give you more specific instructions. Your treatment has been planned according to current medical practices, but problems sometimes occur. Call your caregiver if you have any problems or questions after your procedure. HOME CARE INSTRUCTIONS  You may shower the day after the procedure.Remove the bandage (dressing) and gently wash the site with plain soap and water.Gently pat the site dry.   Do not apply powder or lotion to the site.   Do not submerge the affected site in water for 3 to 5 days.   Inspect the site at least twice daily.   Do not flex or bend the affected arm for 24 hours.   No lifting over 5 pounds (2.3 kg) for 5 days after your procedure.   Do not drive home if you are discharged the same day of the procedure. Have someone else drive you.   You may drive 24 hours after the procedure unless otherwise instructed by your caregiver.  What to expect:  Any bruising will usually fade within 1 to 2 weeks.   Blood that collects in the tissue (hematoma) may be painful to the touch. It should usually decrease in size and tenderness within 1 to 2 weeks.  SEEK IMMEDIATE MEDICAL CARE IF:  You have unusual pain at the radial site.   You have redness, warmth, swelling, or pain at the radial site.   You have drainage (other than a small amount of blood on the dressing).   You have chills.   You have a fever or persistent symptoms for more than 72 hours.   You have a fever and your symptoms suddenly get worse.   Your arm becomes pale, cool, tingly, or numb.   You have heavy bleeding from the site. Hold pressure on the site.

## 2014-10-22 NOTE — Progress Notes (Signed)
Patient Name: Andrea Hughes Date of Encounter: 10/22/2014     Principal Problem:   NSTEMI (non-ST elevated myocardial infarction) Active Problems:   Diabetes   Hyperlipidemia   Hypertension    SUBJECTIVE  No CP feeling great. Ready to go home.   CURRENT MEDS . aspirin EC  81 mg Oral Daily  . atorvastatin  80 mg Oral q1800  . diphenhydrAMINE  25 mg Oral QHS  . enoxaparin (LOVENOX) injection  40 mg Subcutaneous Q24H  . insulin aspart  0-15 Units Subcutaneous TID WC  . insulin aspart  0-5 Units Subcutaneous QHS  . losartan  100 mg Oral Daily  . [START ON 10/24/2014] metFORMIN  500 mg Oral BID WC  . metoprolol tartrate  75 mg Oral BID  . nitroGLYCERIN  1 inch Topical 4 times per day  . prasugrel  10 mg Oral Daily  . sodium chloride  3 mL Intravenous Q12H    OBJECTIVE  Filed Vitals:   10/21/14 2013 10/21/14 2358 10/22/14 0002 10/22/14 0607  BP: 115/51 120/60  96/45  Pulse: 80 79  64  Temp: 97.3 F (36.3 C) 97.1 F (36.2 C)  97.3 F (36.3 C)  TempSrc: Oral Oral  Oral  Resp: 18 20  20   Height:      Weight:   276 lb 0.3 oz (125.2 kg)   SpO2: 96% 97%  98%    Intake/Output Summary (Last 24 hours) at 10/22/14 0721 Last data filed at 10/21/14 2202  Gross per 24 hour  Intake 1203.1 ml  Output   1850 ml  Net -646.9 ml   Filed Weights   10/20/14 0300 10/21/14 0400 10/22/14 0002  Weight: 265 lb 6.4 oz (120.385 kg) 267 lb 11.2 oz (121.428 kg) 276 lb 0.3 oz (125.2 kg)    PHYSICAL EXAM  General: Pleasant, NAD. obese Neuro: Alert and oriented X 3. Moves all extremities spontaneously. Psych: Normal affect. HEENT:  Normal  Neck: Supple without bruits or JVD. Lungs:  Resp regular and unlabored, CTA. Heart: RRR no s3, s4, or murmurs. Abdomen: Soft, non-tender, non-distended, BS + x 4.  Extremities: No clubbing, cyanosis or edema. DP/PT/Radials 2+ and equal bilaterally. Radial site stable  Accessory Clinical Findings  CBC  Recent Labs  10/21/14 0324  10/22/14 0355  WBC 10.4 11.7*  HGB 14.2 11.8*  HCT 41.1 35.3*  MCV 82.0 82.1  PLT 154 175   Basic Metabolic Panel  Recent Labs  10/22/14 0355  NA 144  K 3.8  CL 109  CO2 21  GLUCOSE 186*  BUN 11  CREATININE 0.58  CALCIUM 9.1    Cardiac Enzymes  Recent Labs  10/21/14 0821  CKTOTAL 207*  CKMB 16.5*    TELE  NSR   Radiology/Studies  No results found.  ASSESSMENT AND PLAN  Andrea CurlRobin W Krogstad is a 58 y.o. female with a history of morbid obesity, HTN, HLD, DM and no prior cardiac history who [facility] and [facility] for E[facility] changes and positive troponin. [facility] with NSTEMI and planned coronary angiography. Unfortunately she arrived too late to undergo cardiac catheterization and PCI on Friday and was kept over the weekend on IV heparin for cardiac catheterization the following Monday.   Unstable Angina/NSTEMI: Peak troponin 2.02. -- s/p LHC on 10/21/14 which revealed  -- Severe single-vessel disease involving the mid LAD 9% subtotal occlusion s/p Xience Alpine DES.  -- Low  normal LVEF with mildly elevated LVEDP   -- Difficult to assess LV function due to poor LV Gram -- Continue ASA/Effient, Lopressor 75mg  BID, Lipitor 80mg   -- Patient eager to go home prior to her ECHO for assessment of LV function. She is currently on BB and ARB, will defer this to outpatient follow up visit. No s/s CHF  Hypertension: BP initially difficult to control, but now normal to soft on BB and ARB -- Metoprolol to 75mg  bid and Losartan 100mg    Diabetes: Blood glucose stable. Metformin to be withheld due to procedure. Cont SSI. A1c = 6.5.  -- Can resume metformin 10/24/14 >48 hours post contrast dye exposure.   Hyperlipidemia: LDL above goal (113), continue atrovastatin 80.   Obesity: Overweight. Importance of healthy diet discussed. Encouraged cardiac rehab post catheterization.     Billy FischerSigned, THOMPSON, KATHRYN R PA-C  Pager [phone]  Patient seen, examined. Available data reviewed. Agree with findings, assessment, and plan as outlined by Cline CrockKathryn Thompson, PA-C. Cath note reviewed. Pt stable and feels well. Exam reveals right wrist site without hematoma or tenderness. Heart RRR without murmur. Lungs CTA. No edema. Plan for d/c this am and follow[place] in [place] or [place] ([place] in [place] Importance of med adherence/close f/u reviewed with patient and husband. No work x 2 weeks and maybe up to 4 weeks as she does physical work.   Tonny BollmanMichael Damein Gaunce, M.D. 10/22/2014 7:59 AM

## 2014-10-22 NOTE — Telephone Encounter (Signed)
TCM [facility] 10/27 within 48 hours [facility] 11/4 @ 8:40

## 2014-10-22 NOTE — Discharge Summary (Signed)
Discharge Summary   Patient ID: Andrea Hughes MRN: [id number], DOB/AGE: 56-01-1956 56 y.o. Admit date: 10/18/2014 D/C date:     10/22/2014  Primary Cardiologist: New -will follow up with Dr. Domenic Polite in [place] Problem:   NSTEMI (non-ST elevated myocardial infarction) Active Problems:   Diabetes   Hyperlipidemia   Hypertension   Morbid obesity   CAD (coronary artery disease)   Admission Dates: 10/18/14- 10/22/14 Discharge Diagnosis: NSTEMI s/p DES to her mLAD  HPI: Andrea Hughes is a 58 y.o. female with a history of morbid obesity, HTN, HLD, DM and no prior cardiac history who initially presented to [facility] with chest pain and was transferred to [facility] for EKG changes and positive troponin. She was admitted to [facility] on 10/18/14 with NSTEMI and planned coronary angiography. Unfortunately she arrived too late to undergo cardiac catheterization and PCI on Friday and was kept over the weekend on IV heparin for cardiac catheterization the following Monday.     Hospital Course  Unstable Angina/NSTEMI: Peak troponin 2.02.  -- s/p LHC on 10/21/14 which revealed   -- Severe single-vessel disease involving the mid LAD 99% subtotal occlusion s/p Xience Alpine DES.   -- Low normal LVEF with mildly elevated LVEDP   -- Difficult to assess LV function due to poor LV Gram  -- Continue ASA/Effient, Lopressor 75mg  BID, Lipitor 80mg   -- Patient eager to go home prior to her ECHO for assessment of LV function. She is currently on BB and ARB, will defer this to outpatient follow up visit. No s/s CHF   Hypertension: BP initially difficult to control, but now normal to soft on BB and ARB  -- Continue metoprolol to 75mg  bid and Losartan/HCTZ combo 100/12.5mg     Diabetes: Blood glucose stable. Metformin withheld due to procedure. HgA1c = 6.5.  -- Can resume metformin 10/24/14 >48 hours post contrast dye exposure.   Hyperlipidemia: LDL above goal (113), continue  atrovastatin 80.   Obesity: Overweight. Importance of healthy diet discussed. Encouraged cardiac rehab post catheterization.   The patient has had an uncomplicated hospital course and is recovering well. The radial catheter site is stable. She has been seen by Dr. Burt Knack today and deemed ready for discharge home. All follow-up appointments have been scheduled. A work excuse note was provided for the patient. Discharge medications are listed below.   Discharge Vitals: Blood pressure 131/67, pulse 71, temperature 97.6 F (36.4 C), temperature source Oral, resp. rate 18, height 5\' 4"  (1.626 m), weight 276 lb 0.3 oz (125.2 kg), SpO2 100.00%.  Labs: Lab Results  Component Value Date   WBC 11.7* 10/22/2014   HGB 11.8* 10/22/2014   HCT 35.3* 10/22/2014   MCV 82.1 10/22/2014   PLT 175 10/22/2014     Recent Labs Lab 10/22/14 0355  NA 144  K 3.8  CL 109  CO2 21  BUN 11  CREATININE 0.58  CALCIUM 9.1  GLUCOSE 186*    Recent Labs  10/21/14 0821  CKTOTAL 207*  CKMB 16.5*   Lab Results  Component Value Date   CHOL 188 10/19/2014   HDL 47 10/19/2014   LDLCALC 113* 10/19/2014   TRIG 138 10/19/2014     Diagnostic Studies/Procedures    CARDIAC CATHETERIZATION AND PERCUTANEOUS CORONARY INTERVENTION REPORT  NAME: Andrea Hughes MRN: [id number]  DOB: 12/18/1956 ADMIT DATE: 10/18/2014  Procedure Date: 10/21/2014  INTERVENTIONAL CARDIOLOGIST: Leonie Man, M.D., MS  PRIMARY CARE PROVIDER: No PCP Per Patient  PRIMARY  CARDIOLOGIST: New to [facility] -- Initially seen by Dr. Percival Spanish, will be followed up at [facility] office.  PATIENT: Andrea Hughes is a 58 y.o. female with obesity, diabetes , hypertension and hyperlipidemia who presented with a non-ST elevation MI On the 23rd of October. Symptoms began on the 22nd as 5 out of 10 substernal heaviness. She initially presented to [facility] and was transferred to [facility] for EKG changes and troponin  positive.  Unfortunately she arrived too late to undergo cardiac catheterization and PCI on Friday and was maintained in the hospital on IV heparin pending cardiac catheterization today.  PRE-OPERATIVE DIAGNOSIS:  Principal Problem:  NSTEMI (non-ST elevated myocardial infarction)  Active Problems:  Diabetes  Hyperlipidemia  Hypertension   PROCEDURES PERFORMED:  Left Heart Catheterization with Native Coronary Angiography via Right Radial Artery  Left Ventriculography PERCUTANEOUS CORONARY INTERVENTION OF MID LAD - XIENCE ALPINE DES 3.0 MM X 18 MM ( 3.24 mm distal, 3 6 mm proximal) PROCEDURE: The patient was brought to the [facility] in the fasting state and prepped and draped in the usual sterile fashion for Right radial artery access. A modified Allen's test was performed on the right wrist demonstrating excellent collateral flow for radial access. Sterile technique was used including antiseptics, cap, gloves, gown, hand hygiene, mask and sheet. Skin prep: Chlorhexidine.  Consent: Risks of procedure as well as the alternatives and risks of each were explained to the (patient/caregiver). Consent for procedure obtained.  Time Out: Verified patient identification, verified procedure, site/side was marked, verified correct patient position, special equipment/implants available, medications/allergies/relevent history reviewed, required imaging and test results available. Performed.  Access:  RIGHT RADIAL Artery: 6 Fr Sheath - Seldinger Technique (Angiocath Micropuncture Kit)  Radial Cocktail - 10 mL; IV Heparin 5000 Units  Left Heart Catheterization: 5Fr Catheters advanced or exchanged over a Long exchange safety J-wire; TIG 4.0 catheter advanced first.  Left & Right Coronary Artery Cineangiography: TIG 4.0 Catheter  LV Hemodynamics (LV Gram): Angled Pigtail Catheter Sheath removed in the Cardiac catheterization lab with VASC Band applied for hemostasis.  VASC  Band: 1035 Hours; 14 mL air  FINDINGS:  Hemodynamics:  Central Aortic Pressure / Mean: 130/79/101 mmHg  Left Ventricular Pressure / LVEDP: 130/6/12 mmHg Left Ventriculography:  EF: 50-55 %  Wall Motion: POSSIBLE MILD ANTERIOR HYPOKINESIS ( POOR IMAGING ) Coronary Anatomy:  Dominance: RIGHT Left Main: Normal caliber vessel that bifurcates into the LAD and almost codominant circumflex. LAD: Large-caliber vessel with 2 proximal diagonal branches there are small in diameter. Following the third slightly larger (smaller to moderate caliber) D3, there is a focal thrombotic 99% subtotal occlusion. Beyond this lesion the vessel then ormalizes and reached down around the inferoapex. There is minimal involvement with maybe 20% stenosis of the D3 ostium..  Left Circumflex: Large caliber vessel that promptly bifurcates into a large lateral ramus-like OM that bifurcates in the mid vessel with both branches reaching almost out of the inferolateral Wall/apex. The AV groove circumflex continues distally in bifurcates into 2 branches 1 is probably a second OM last branch is a LPL. Minimal luminal irregularities  RCA: Large caliber, dominant vessel that bifurcates distally into the right posterior descending artery (RPDA ) Right Posterior AV Groove Branch (RPAV). Minimal luminal irregularities. Several large marginal branches and the mid vessel.  RPDA: Large-caliber vessel reaches almost all the way to the apex. Somewhat tortuous but free of disease.  RPL Sysytem:The RPAV Begins as a moderate large-caliber vessel  bifurcates into RPL 1 and 2 as well as giving off the AV nodal artery. Minimal luminal irregularities After reviewing the initial angiography, the culprit lesion was thought to be 99% subtotal occlusion of the mid LAD. Preparation were made to proceed with PCI on this lesion. Because this was just distal to the third diagonal branch, it was initially felt to be prudent to place a wire into the branch vessel  as well as down the parent vessel for PCI.  Percutaneous Coronary Intervention: Sheath exchanged for 6 Fr  Guide: 6 Fr XB LAD 3.5 Guidewire: BMW wire for D3, ProWater for LAD  LESION: 99% thrombotic occlusion of mid LAD with TIMI-3 flow pre-and post. Reduced to 0%  Predilation: Emerge 2.5 mm x 12 mm --> 10 Atm x 30 Sec  Stent: XIENCE ALPINE DES 3.0 MM X 18 MM (jails D3) --> 16 Atm x 45 Sec (distal diameter ~3.24 mm)  Post-dilation: Alba Trek 3.5 mm x 12 mm --> 16 Atm x 45 Atm Post deployment angiography in multiple views, with and without guidewire in place revealed excellent stent deployment and lesion coverage. There was no evidence of dissection or perforation.  MEDICATIONS:  Anesthesia: Local Lidocaine 2 ml Sedation: 2 mg IV Versed, 50 mcg IV fentanyl ;  Premedication: IV - 125 g Solu-Medrol, 20 mg Pepcid, 25 mg Benadryl for reported contrast allergy. Omnipaque Contrast: 170 ml  Anticoagulation: IV Heparin 5000 Units ; Angiomax Bolus & drip  Anti-Platelet Agent: Effient 10 mg daily Radial Cocktail: 5 mg Verapamil, 400 mcg NTG, 2 ml 2% Lidocaine in 10 ml NS  IC NTG 200 mcg x 1  IV Zofran (for post-sedation nausea)  IV Lopressor 5 mg PATIENT DISPOSITION:  The patient was transferred to the PACU holding area in a hemodynamicaly stable, chest pain free condition.  The patient tolerated the procedure well, and there were no complications. EBL: < 10 ml  The patient was stable before, during, and after the procedure. POST-OPERATIVE DIAGNOSIS:  Severe single-vessel disease involving the mid LAD 9% subtotal occlusion successfully treated with a single Xience Alpine DES.  Low normal LVEF with mildly elevated LVEDP  Difficult to assess LV function due to poor LV Gram PLAN OF CARE:  Patient is [facility] to [facility] postprocedure unit for standard post radial cath care.  Anticipate discharge in the morning  Dual antiplatelet therapy for minimum of one year.  Aggressive cardiac risk factor  modification.  The patient is probably best served following up in either the [employer] or [employer].   Discharge Medications     Medication List    STOP taking these medications       metoprolol succinate 50 MG 24 hr tablet  Commonly known as:  TOPROL-XL      TAKE these medications       aspirin 81 MG EC tablet  Take 1 tablet (81 mg total) by mouth daily.     atorvastatin 80 MG tablet  Commonly known as:  LIPITOR  Take 1 tablet (80 mg total) by mouth daily at 6 PM.     diphenhydrAMINE 25 MG tablet  Commonly known as:  SOMINEX  Take 25 mg by mouth daily.     losartan-hydrochlorothiazide 100-12.5 MG per tablet  Commonly known as:  HYZAAR  Take 1 tablet by mouth daily.     metFORMIN 1000 MG tablet  Commonly known as:  GLUCOPHAGE  Take 2 tablets (2,000 mg total) by mouth daily.  Start taking on:  10/24/2014  metoprolol tartrate 25 MG tablet  Commonly known as:  LOPRESSOR  Take 3 tablets (75 mg total) by mouth 2 (two) times daily.     nitroGLYCERIN 0.4 MG SL tablet  Commonly known as:  NITROSTAT  Place 1 tablet (0.4 mg total) under the tongue every 5 (five) minutes x 3 doses as needed for chest pain.     prasugrel 10 MG Tabs tablet  Commonly known as:  EFFIENT  Take 1 tablet (10 mg total) by mouth daily.     TRULICITY 2.17 GJ/1.5NB Sopn  Generic drug:  Dulaglutide  Inject 0.75 mg into the skin every 7 (seven) days.     TYLENOL PM EXTRA STRENGTH PO  Take 1 tablet by mouth every morning.     VITAMIN D PO  Take 1 tablet by mouth daily.        Disposition   The patient will be discharged in stable condition to home. Discharge Instructions   Amb Referral to Cardiac Rehabilitation    Complete by:  As directed           Follow-up Information   Follow up with Rozann Lesches, MD On 10/30/2014. ($RemoveBefore'@8'SYnFuMumDCHUH$ :40 am)    Specialty:  Cardiology   Contact information:   [place] [address] [phone]         Duration of Discharge  Encounter: Greater than 30 minutes including physician and PA time.  Mable Fill R PA-C 10/22/2014, 11:24 AM

## 2014-10-23 ENCOUNTER — Emergency Department (HOSPITAL_COMMUNITY): Payer: 59

## 2014-10-23 ENCOUNTER — Encounter (HOSPITAL_COMMUNITY): Payer: Self-pay | Admitting: Emergency Medicine

## 2014-10-23 ENCOUNTER — Inpatient Hospital Stay (HOSPITAL_COMMUNITY)
Admission: EM | Admit: 2014-10-23 | Discharge: 2014-10-25 | DRG: 281 | Disposition: A | Discharge status: Home / Self Care | Payer: 59 | Attending: Cardiovascular Disease | Admitting: Cardiovascular Disease

## 2014-10-23 DIAGNOSIS — Z880 Allergy status to penicillin: Secondary | ICD-10-CM

## 2014-10-23 DIAGNOSIS — F329 Major depressive disorder, single episode, unspecified: Secondary | ICD-10-CM | POA: Diagnosis present

## 2014-10-23 DIAGNOSIS — I222 Subsequent non-ST elevation (NSTEMI) myocardial infarction: Principal | ICD-10-CM | POA: Diagnosis present

## 2014-10-23 DIAGNOSIS — I2510 Atherosclerotic heart disease of native coronary artery without angina pectoris: Secondary | ICD-10-CM

## 2014-10-23 DIAGNOSIS — E876 Hypokalemia: Secondary | ICD-10-CM | POA: Diagnosis present

## 2014-10-23 DIAGNOSIS — Z9861 Coronary angioplasty status: Secondary | ICD-10-CM

## 2014-10-23 DIAGNOSIS — I5030 Unspecified diastolic (congestive) heart failure: Secondary | ICD-10-CM | POA: Diagnosis present

## 2014-10-23 DIAGNOSIS — E785 Hyperlipidemia, unspecified: Secondary | ICD-10-CM | POA: Diagnosis present

## 2014-10-23 DIAGNOSIS — Z09 Encounter for follow-up examination after completed treatment for conditions other than malignant neoplasm: Secondary | ICD-10-CM

## 2014-10-23 DIAGNOSIS — Z8249 Family history of ischemic heart disease and other diseases of the circulatory system: Secondary | ICD-10-CM

## 2014-10-23 DIAGNOSIS — R079 Chest pain, unspecified: Secondary | ICD-10-CM

## 2014-10-23 DIAGNOSIS — I25111 Atherosclerotic heart disease of native coronary artery with angina pectoris with documented spasm: Secondary | ICD-10-CM | POA: Diagnosis present

## 2014-10-23 DIAGNOSIS — I252 Old myocardial infarction: Secondary | ICD-10-CM

## 2014-10-23 DIAGNOSIS — I10 Essential (primary) hypertension: Secondary | ICD-10-CM | POA: Diagnosis present

## 2014-10-23 DIAGNOSIS — Z6841 Body Mass Index (BMI) 40.0 and over, adult: Secondary | ICD-10-CM

## 2014-10-23 DIAGNOSIS — E782 Mixed hyperlipidemia: Secondary | ICD-10-CM | POA: Diagnosis present

## 2014-10-23 DIAGNOSIS — E669 Obesity, unspecified: Secondary | ICD-10-CM | POA: Diagnosis present

## 2014-10-23 DIAGNOSIS — Z91041 Radiographic dye allergy status: Secondary | ICD-10-CM

## 2014-10-23 DIAGNOSIS — I25110 Atherosclerotic heart disease of native coronary artery with unstable angina pectoris: Secondary | ICD-10-CM

## 2014-10-23 DIAGNOSIS — Z7982 Long term (current) use of aspirin: Secondary | ICD-10-CM

## 2014-10-23 DIAGNOSIS — R071 Chest pain on breathing: Secondary | ICD-10-CM

## 2014-10-23 DIAGNOSIS — I2109 ST elevation (STEMI) myocardial infarction involving other coronary artery of anterior wall: Secondary | ICD-10-CM

## 2014-10-23 DIAGNOSIS — Z955 Presence of coronary angioplasty implant and graft: Secondary | ICD-10-CM

## 2014-10-23 DIAGNOSIS — I214 Non-ST elevation (NSTEMI) myocardial infarction: Secondary | ICD-10-CM | POA: Diagnosis present

## 2014-10-23 DIAGNOSIS — IMO0002 Reserved for concepts with insufficient information to code with codable children: Secondary | ICD-10-CM

## 2014-10-23 DIAGNOSIS — E119 Type 2 diabetes mellitus without complications: Secondary | ICD-10-CM | POA: Diagnosis present

## 2014-10-23 DIAGNOSIS — Z882 Allergy status to sulfonamides status: Secondary | ICD-10-CM

## 2014-10-23 DIAGNOSIS — Z809 Family history of malignant neoplasm, unspecified: Secondary | ICD-10-CM

## 2014-10-23 LAB — BASIC METABOLIC PANEL
Anion gap: 15 (ref 5–15)
BUN: 22 mg/dL (ref 6–23)
CO2: 22 mEq/L (ref 19–32)
Calcium: 9.3 mg/dL (ref 8.4–10.5)
Chloride: 107 mEq/L (ref 96–112)
Creatinine, Ser: 0.64 mg/dL (ref 0.50–1.10)
GFR calc Af Amer: >90 mL/min (ref >90)
GFR calc non Af Amer: >90 mL/min (ref >90)
Glucose, Bld: 145 mg/dL — ABNORMAL HIGH (ref 70–99)
Potassium: 3.8 mEq/L (ref 3.7–5.3)
Sodium: 144 mEq/L (ref 137–147)

## 2014-10-23 LAB — CBC
HCT: 38.7 % (ref 36.0–46.0)
Hemoglobin: 13.1 g/dL (ref 12.0–15.0)
MCH: 28.5 pg (ref 26.0–34.0)
MCHC: 33.9 g/dL (ref 30.0–36.0)
MCV: 84.3 fL (ref 78.0–100.0)
Platelets: 157 10*3/uL (ref 150–400)
RBC: 4.59 MIL/uL (ref 3.87–5.11)
RDW: 14.2 % (ref 11.5–15.5)
WBC: 10.6 10*3/uL — ABNORMAL HIGH (ref 4.0–10.5)

## 2014-10-23 LAB — TROPONIN I
TROPONIN I: 1.85 ng/mL — AB (ref <0.30)
Troponin I: 1.56 ng/mL (ref <0.30)
Troponin I: 1.78 ng/mL (ref <0.30)

## 2014-10-23 LAB — PROTIME-INR
INR: 1.06 (ref 0.00–1.49)
PROTHROMBIN TIME: 13.9 s (ref 11.6–15.2)

## 2014-10-23 LAB — HEPARIN LEVEL (UNFRACTIONATED): Heparin Unfractionated: 0.13 IU/mL — ABNORMAL LOW (ref 0.30–0.70)

## 2014-10-23 LAB — MRSA PCR SCREENING: MRSA by PCR: NEGATIVE

## 2014-10-23 MED ORDER — NITROGLYCERIN 0.4 MG SL SUBL
0.4000 mg | SUBLINGUAL_TABLET | SUBLINGUAL | Status: AC | PRN
  Administered 2014-10-23 (×3): 0.4 mg via SUBLINGUAL
  Filled 2014-10-23 (×2): qty 1

## 2014-10-23 MED ORDER — DIPHENHYDRAMINE HCL (SLEEP) 25 MG PO TABS: 25.0000 mg | ORAL_TABLET | Freq: Every evening | ORAL | Status: DC | PRN

## 2014-10-23 MED ORDER — DIPHENHYDRAMINE HCL 25 MG PO CAPS: 25.0000 mg | ORAL_CAPSULE | Freq: Every evening | ORAL | Status: DC | PRN

## 2014-10-23 MED ORDER — ONDANSETRON HCL 4 MG/2ML IJ SOLN: 4.0000 mg | Freq: Four times a day (QID) | INTRAMUSCULAR | Status: DC | PRN

## 2014-10-23 MED ORDER — ASPIRIN EC 81 MG PO TBEC
81.0000 mg | DELAYED_RELEASE_TABLET | Freq: Every day | ORAL | Status: DC
  Administered 2014-10-24 – 2014-10-25 (×2): 81 mg via ORAL
  Filled 2014-10-23 (×2): qty 1

## 2014-10-23 MED ORDER — NITROGLYCERIN 0.4 MG SL SUBL: 0.4000 mg | SUBLINGUAL_TABLET | SUBLINGUAL | Status: DC | PRN

## 2014-10-23 MED ORDER — LOSARTAN POTASSIUM-HCTZ 100-12.5 MG PO TABS: 1.0000 | ORAL_TABLET | Freq: Every day | ORAL | Status: DC

## 2014-10-23 MED ORDER — ACETAMINOPHEN 325 MG PO TABS
650.0000 mg | ORAL_TABLET | ORAL | Status: DC | PRN
Start: 2014-10-23 — End: 2014-10-25

## 2014-10-23 MED ORDER — METOPROLOL TARTRATE 50 MG PO TABS
75.0000 mg | ORAL_TABLET | Freq: Two times a day (BID) | ORAL | Status: DC
  Administered 2014-10-23 – 2014-10-24 (×4): 75 mg via ORAL
  Filled 2014-10-23 (×8): qty 1

## 2014-10-23 MED ORDER — ATORVASTATIN CALCIUM 80 MG PO TABS
80.0000 mg | ORAL_TABLET | Freq: Every day | ORAL | Status: DC
  Administered 2014-10-23 – 2014-10-24 (×2): 80 mg via ORAL
  Filled 2014-10-23 (×3): qty 1

## 2014-10-23 MED ORDER — NITROGLYCERIN 2 % TD OINT
0.5000 [in_us] | TOPICAL_OINTMENT | Freq: Four times a day (QID) | TRANSDERMAL | Status: DC
  Administered 2014-10-23 – 2014-10-24 (×5): 0.5 [in_us] via TOPICAL
  Filled 2014-10-23: qty 30

## 2014-10-23 MED ORDER — LOSARTAN POTASSIUM 50 MG PO TABS
100.0000 mg | ORAL_TABLET | Freq: Every day | ORAL | Status: DC
  Administered 2014-10-23 – 2014-10-25 (×3): 100 mg via ORAL
  Filled 2014-10-23 (×4): qty 2

## 2014-10-23 MED ORDER — MORPHINE SULFATE 2 MG/ML IJ SOLN
2.0000 mg | INTRAMUSCULAR | Status: DC | PRN
  Administered 2014-10-23: 2 mg via INTRAVENOUS
  Filled 2014-10-23: qty 1

## 2014-10-23 MED ORDER — HYDROCHLOROTHIAZIDE 12.5 MG PO CAPS
12.5000 mg | ORAL_CAPSULE | Freq: Every day | ORAL | Status: DC
  Administered 2014-10-23 – 2014-10-25 (×3): 12.5 mg via ORAL
  Filled 2014-10-23 (×3): qty 1

## 2014-10-23 MED ORDER — HEPARIN BOLUS VIA INFUSION
4000.0000 [IU] | INTRAVENOUS | Status: AC
  Administered 2014-10-23: 4000 [IU] via INTRAVENOUS
  Filled 2014-10-23: qty 4000

## 2014-10-23 MED ORDER — SODIUM CHLORIDE 0.9 % IJ SOLN: 3.0000 mL | INTRAMUSCULAR | Status: DC | PRN

## 2014-10-23 MED ORDER — SODIUM CHLORIDE 0.9 % IV SOLN: 250.0000 mL | INTRAVENOUS | Status: DC | PRN

## 2014-10-23 MED ORDER — HEPARIN (PORCINE) IN NACL 100-0.45 UNIT/ML-% IJ SOLN
1800.0000 [IU]/h | INTRAMUSCULAR | Status: DC
  Administered 2014-10-23: 1600 [IU]/h via INTRAVENOUS
  Administered 2014-10-23 (×2): 1800 [IU]/h via INTRAVENOUS
  Filled 2014-10-23 (×5): qty 250

## 2014-10-23 MED ORDER — HEPARIN BOLUS VIA INFUSION
2000.0000 [IU] | Freq: Once | INTRAVENOUS | Status: AC
  Administered 2014-10-23: 2000 [IU] via INTRAVENOUS
  Filled 2014-10-23: qty 2000

## 2014-10-23 MED ORDER — MORPHINE SULFATE 4 MG/ML IJ SOLN
4.0000 mg | Freq: Once | INTRAMUSCULAR | Status: AC
  Administered 2014-10-23: 4 mg via INTRAVENOUS
  Filled 2014-10-23: qty 1

## 2014-10-23 MED ORDER — SODIUM CHLORIDE 0.9 % IJ SOLN: 3.0000 mL | Freq: Two times a day (BID) | INTRAMUSCULAR | Status: DC

## 2014-10-23 MED ORDER — ASPIRIN 300 MG RE SUPP: 300.0000 mg | RECTAL | Status: AC

## 2014-10-23 MED ORDER — PRASUGREL HCL 10 MG PO TABS
10.0000 mg | ORAL_TABLET | Freq: Every day | ORAL | Status: DC
  Administered 2014-10-23 – 2014-10-25 (×3): 10 mg via ORAL
  Filled 2014-10-23 (×3): qty 1

## 2014-10-23 MED ORDER — ASPIRIN 81 MG PO CHEW: 324.0000 mg | CHEWABLE_TABLET | ORAL | Status: AC

## 2014-10-23 NOTE — ED Notes (Signed)
Cardiology at bedside.

## 2014-10-23 NOTE — Telephone Encounter (Signed)
Patient is currently inpatient at [facility].

## 2014-10-23 NOTE — Progress Notes (Signed)
ANTICOAGULATION CONSULT NOTE  Pharmacy Consult for Heparin Indication: chest pain/ACS  Allergies  Allergen Reactions  . Contrast Media [Iodinated Diagnostic Agents] Itching and Swelling  . Sulfa Drugs Cross Reactors Swelling  . Penicillins Rash    Patient Measurements: Height: 5\' 4"  (162.6 cm) Weight: 275 lb (124.739 kg) IBW/kg (Calculated) : 54.7 Heparin Dosing Weight: 85kg    Vital Signs: Temp: 98.3 F (36.8 C) (10/28 0854) Temp Source: Oral (10/28 0854) BP: 144/65 mmHg (10/28 1700) Pulse Rate: 82 (10/28 1447)  Labs:  Recent Labs  10/21/14 0324 10/21/14 0821 10/22/14 0355 10/23/14 0857 10/23/14 1640 10/23/14 1800  HGB 14.2  --  11.8* 13.1  --   --   HCT 41.1  --  35.3* 38.7  --   --   PLT 154  --  175 157  --   --   LABPROT  --   --   --   --  13.9  --   INR  --   --   --   --  1.06  --   HEPARINUNFRC 0.38  --   --   --   --  0.13*  CREATININE  --   --  0.58 0.64  --   --   CKTOTAL  --  207*  --   --   --   --   CKMB  --  16.5*  --   --   --   --   TROPONINI  --   --   --  1.56* 1.78*  --     Assessment:  58 y.o female presented to ED from home today. Pt had MI on Friday 10/18/14, stent placement Mon 10/26 and discharge from our hospital yesterday 10/22/14.  Pt having upper left chest pain today radiating into left shoulder.  Started on heparin at rate of 1600units/hr (she was therapeutic on this during her past admission), however initial heparin level is low at 0.13. No noted issues. No bleeding noted.   Goal of Therapy:  Heparin level 0.3-0.7 units/ml Monitor platelets by anticoagulation protocol: Yes    Plan: 1. Increase heparin infusion to 1800 units/hr after a bolus of 2000 units IV x1 2. Heparin level in 6 hours 3. Daily HL and CBC 4. Follow for interventional plans (noted possible cath in AM)  Willye Javier D. Ahlayah Tarkowski, PharmD, BCPS Clinical Pharmacist Pager: [phone] 10/23/2014 7:56 PM

## 2014-10-23 NOTE — Progress Notes (Signed)
     Troponin noted to be trending upwards. Continue IV heparin. No more CP after morphine. Now sleeping comfortably. Ordered an ECG  If chest pain returns. Continue to monitor troponin. Will have her rounded on early tomorrow AM. NPO after midnight for possible cath tomorrow.   Cline CrockKathryn Mecca Guitron PA-C  [facility]

## 2014-10-23 NOTE — Progress Notes (Signed)
ANTICOAGULATION CONSULT NOTE - Initial Consult  Pharmacy Consult for Heparin Indication: chest pain/ACS  Allergies  Allergen Reactions  . Contrast Media [Iodinated Diagnostic Agents] Itching and Swelling  . Sulfa Drugs Cross Reactors Swelling  . Penicillins Rash    Patient Measurements: Height: 5\' 4"  (162.6 cm) (from 10/18/14 @ 3:00 PM) Weight: 276 lb 1 oz (125.22 kg) (from 10/22/14 @ 12:02 AM) IBW/kg (Calculated) : 54.7 Heparin Dosing Weight: 85.4 kg    Vital Signs: Temp: 98.3 F (36.8 C) (10/28 0854) Temp Source: Oral (10/28 0854) BP: 136/59 mmHg (10/28 0919) Pulse Rate: 68 (10/28 0919)  Labs:  Recent Labs  10/20/14 1550  10/21/14 0324 10/21/14 0821 10/22/14 0355 10/23/14 0857  HGB  --   < > 14.2  --  11.8* 13.1  HCT  --   --  41.1  --  35.3* 38.7  PLT  --   --  154  --  175 157  HEPARINUNFRC 0.43  --  0.38  --   --   --   CREATININE  --   --   --   --  0.58 0.64  CKTOTAL  --   --   --  207*  --   --   CKMB  --   --   --  16.5*  --   --   TROPONINI  --   --   --   --   --  1.56*  < > = values in this interval not displayed.  Estimated Creatinine Clearance: 102.8 ml/min (by C-G formula based on Cr of 0.64).   Medical History: Past Medical History  Diagnosis Date  . Hypertension   . Diabetes mellitus   . Depression   . Hyperlipidemia   . Morbid obesity   . CAD (coronary artery disease)     a. 09/2014 NSTEMI s/p DES to mLAD   Assessment:  58 y.o female presented to ED from home today. Pt had MI on Friday 10/18/14, stent placement Mon 10/26 and discharge from our hospital yesterday 10/22/14.  Pt having upper left chest pain today radiating into left shoulder.  She received IV heparin infusion on previous admission with therapeutic heparin level on 1600 units/hr on 10/21/14.   Received lovenox 40mg  SQ x1 dose yesterday AM 10/22/14 prior to discharge.  PTA on Effient 10mg  daily.  No bleeding noted per RN.   Goal of Therapy:  Heparin level 0.3-0.7  units/ml Monitor platelets by anticoagulation protocol: Yes    Plan: Heparin 4000 units IV bolus x1 Start IV heparin drip at 1600 units/hr Check heparin level 6 hours after start of heparin. Daily heparin level and CBC  Noah Delaineuth Harlen Danford, RPh Clinical Pharmacist Pager: [phone] 10/23/2014,11:05 AM

## 2014-10-23 NOTE — ED Notes (Signed)
Patient transported to X-ray 

## 2014-10-23 NOTE — ED Provider Notes (Signed)
CSN: [id number]     Arrival date & time 10/23/14  [id number] History   First MD Initiated Contact with Patient 10/23/14 [phone]     Chief Complaint  Patient presents with  . Chest Pain     (Consider location/radiation/quality/duration/timing/severity/associated sxs/prior Treatment) HPI Pt is a 58yo female with hx of HTN, DM, hyperlipidemia, and CAD presenting to ED with c/o left upper chest pain that started last night before bed, has been constant since. Pain is difficult for pt to describe but states it is 5/10 at this time, worse with deep breathing. Denies radiation of pain. States she took 324mg  aspirin and 2 nitro PTA around 7:30AM w/o relief of symptoms. Denies diaphoresis, nausea or vomiting.  Pt reports having a stent placed on Monday, 10/26, two days ago by Dr. Herbie BaltimoreHarding for an NSTEMI.  States she felt well when she was discharged yesterday around 1PM and states she was "just resting" when pain started last night.    Past Medical History  Diagnosis Date  . Hypertension   . Diabetes mellitus   . Depression   . Hyperlipidemia   . Morbid obesity   . CAD (coronary artery disease)     a. 09/2014 NSTEMI s/p DES to mLAD   Past Surgical History  Procedure Laterality Date  . Tubal ligation    . Back surgery  1988  . Cholecystectomy  2004  . Breast surgery      1984  . Tonsillectomy      58 years old  . Colonoscopy  09/07/2011    Procedure: COLONOSCOPY;  Surgeon: Dalia HeadingMark A Jenkins;  Location: [facility];  Service: Gastroenterology;  Laterality: N/A;  . Vaginal hysterectomy    . Bladder suspension     Family History  Problem Relation Age of Onset  . Cancer Father     Lung  . Heart disease Mother     Valve replacement    History  Substance Use Topics  . Smoking status: Never Smoker   . Smokeless tobacco: Not on file  . Alcohol Use: No   OB History   Grav Para Term Preterm Abortions TAB SAB Ect Mult Living                 Review of Systems  Constitutional: Negative for fever  and chills.  Respiratory: Positive for shortness of breath. Negative for cough.   Cardiovascular: Positive for chest pain ( left upper chest). Negative for palpitations and leg swelling.  Gastrointestinal: Negative for nausea, vomiting and diarrhea.  All other systems reviewed and are negative.     Allergies  Contrast media; Sulfa drugs cross reactors; and Penicillins  Home Medications   Prior to Admission medications   Medication Sig Start Date End Date Taking? Authorizing Provider  aspirin EC 81 MG EC tablet Take 1 tablet (81 mg total) by mouth daily. 10/22/14  Yes Janetta HoraKathryn R Thompson, PA-C  atorvastatin (LIPITOR) 80 MG tablet Take 1 tablet (80 mg total) by mouth daily at 6 PM. 10/22/14  Yes Janetta HoraKathryn R Thompson, PA-C  Cholecalciferol (VITAMIN D PO) Take 1 tablet by mouth daily.   Yes Historical Provider, MD  diphenhydrAMINE (SOMINEX) 25 MG tablet Take 25 mg by mouth at bedtime as needed for sleep.    Yes Historical Provider, MD  Diphenhydramine-APAP, sleep, (TYLENOL PM EXTRA STRENGTH PO) Take 1 tablet by mouth daily as needed (for pain).    Yes Historical Provider, MD  losartan-hydrochlorothiazide (HYZAAR) 100-12.5 MG per tablet Take 1 tablet by mouth  daily.  08/28/14  Yes Historical Provider, MD  metFORMIN (GLUCOPHAGE XR) 500 MG 24 hr tablet Take 4 tablets (2,000 mg total) by mouth daily with breakfast. 10/24/14  Yes Janetta Hora, PA-C  metoprolol tartrate (LOPRESSOR) 25 MG tablet Take 3 tablets (75 mg total) by mouth 2 (two) times daily. 10/22/14  Yes Janetta Hora, PA-C  nitroGLYCERIN (NITROSTAT) 0.4 MG SL tablet Place 1 tablet (0.4 mg total) under the tongue every 5 (five) minutes x 3 doses as needed for chest pain. 10/22/14  Yes Janetta Hora, PA-C  prasugrel (EFFIENT) 10 MG TABS tablet Take 1 tablet (10 mg total) by mouth daily. 10/22/14  Yes Janetta Hora, PA-C  TRULICITY 0.75 MG/0.5ML SOPN Inject 0.75 mg into the skin every 7 (seven) days. On Sunday 09/23/14  Yes  Historical Provider, MD   BP 135/59  Pulse 78  Temp(Src) 98.3 F (36.8 C) (Oral)  Resp 21  Ht 5\' 4"  (1.626 m)  Wt 276 lb 1 oz (125.22 kg)  BMI 47.36 kg/m2  SpO2 99% Physical Exam  Nursing note and vitals reviewed. Constitutional: She appears well-developed and well-nourished.  Obese female lying in exam bed, appears uncomfortable.   HENT:  Head: Normocephalic and atraumatic.  Eyes: Conjunctivae are normal. No scleral icterus.  Neck: Normal range of motion.  Cardiovascular: Normal rate, regular rhythm and normal heart sounds.   Regular rate and rhythm  Pulmonary/Chest: Effort normal and breath sounds normal. No respiratory distress. She has no wheezes. She has no rales. She exhibits no tenderness.  Lungs: CTAB, mild SOB, appears short of breat between sentences. No accessory muscle use  Abdominal: Soft. Bowel sounds are normal. She exhibits no distension and no mass. There is no tenderness. There is no rebound and no guarding.  Musculoskeletal: Normal range of motion.  Neurological: She is alert.  Skin: Skin is warm and dry. She is not diaphoretic.    ED Course  Procedures (including critical care time) Labs Review Labs Reviewed  CBC - Abnormal; Notable for the following:    WBC 10.6 (*)    All other components within normal limits  BASIC METABOLIC PANEL - Abnormal; Notable for the following:    Glucose, Bld 145 (*)    All other components within normal limits  TROPONIN I - Abnormal; Notable for the following:    Troponin I 1.56 (*)    All other components within normal limits  HEPARIN LEVEL (UNFRACTIONATED)    Imaging Review Dg Chest 2 View  10/23/2014   CLINICAL DATA:  Chest pain.  Recent stent.  EXAM: CHEST  2 VIEW  COMPARISON:  10/18/2014.  FINDINGS: Mediastinum and hilar structures are unremarkable. Mild cardiomegaly. Mild pulmonary vascular prominence. Mild interstitial prominence suggesting interstitial edema. Mild basilar atelectasis. No pleural effusion or  pneumothorax. No acute osseous abnormality.  IMPRESSION: 1. Cardiomegaly with very mild interstitial prominence suggesting congestive heart failure with mild interstitial edema.  2.  Mild basilar atelectasis.   Electronically Signed   By: Maisie Fus  Register   On: 10/23/2014 09:57     EKG Interpretation   Date/Time:  Wednesday October 23 2014 08:49:14 EDT Ventricular Rate:  69 PR Interval:  168 QRS Duration: 97 QT Interval:  428 QTC Calculation: 458 R Axis:   -17 Text Interpretation:  Sinus rhythm Left ventricular hypertrophy Anterior Q  waves, possibly due to LVH Borderline T abnormalities, inferior leads No  significant change since last tracing Confirmed by GOLDSTON  MD, SCOTT  ([id number]) on 10/23/2014 8:59:26  AM      MDM   Final diagnoses:  Chest pain    Pt c/o chest pain that started last night. Discharged yesterday after stent placed on Monday, 10/26 (two days ago) by Dr. Herbie BaltimoreHarding, cardiology.  Pain initially 5/10 upon arrival to ED after pt had 2 nitro and 324mg  aspirin around 7:30AM this morning.  No respiratory distress. Pain is left upper chest.  Pain improved to 2/10 after 4mg  morphine.  Discussed pt with Dr. Criss AlvineGoldston, pt given 0.4SL nitro in ED.    Troponin: elevated- 1.56, down from 2.02 on 10/19/14.  Will consult with cardiology   10:38 AM Consulted with Ward Givenshris Berge, NP cardiology, believes troponin is still trending downward, however, due to pt's risk factors, will start pt on heparin.   Cardiology admitting pt.      Junius Finnerrin O'Malley, PA-C 10/23/14 1327

## 2014-10-23 NOTE — ED Notes (Addendum)
Attempted report 

## 2014-10-23 NOTE — ED Notes (Signed)
Critical troponin of 1.56, EDP notified.

## 2014-10-23 NOTE — H&P (Signed)
Patient ID: Andrea CurlRobin W Navratil MRN: [id number], DOB/AGE: [birth year]   Admit date: 10/23/2014   Primary Physician: No PCP Per Patient Primary Cardiologist: Dr. Diona BrownerMcDowell  Pt. Profile:  Andrea Hughes is a 58 y.o. female with a history of morbid obesity, HTN, HLD, DM and CAD s/p very recent NSTEMI s/p DES to mLAD on 10/21/14 who [facility] with chest pain.   The patient was discharged yesterday after stenting of her LAD on 10/21/2014. She was feeling well at that time. Later that night she developed upper left chest pain that felt like gas. It was 7/10 and constant and not relieved by nitroglycerin. However, the pain was made worse with deep inspiration. No relation to recumbency and not necessarily better sitting up and leaning forward. Of note, this pain was not reminiscent of her previous cardiac chest pain prior to her recent NSTEMI. However, today she continued to have this chest pain and it also some more global tightness in her chest that was reminiscent of her prior angina. She denies shortness of breath or diaphoresis. No radiation although her jaw felt "a little funny" earlier today. She was supposed to have an ECHO yesterday but opted to have this done as an outpatient.   Problem List  Past Medical History  Diagnosis Date  . Hypertension   . Diabetes mellitus   . Depression   . Hyperlipidemia   . Morbid obesity   . CAD (coronary artery disease)     a. 09/2014 NSTEMI s/p DES to mLAD    Past Surgical History  Procedure Laterality Date  . Tubal ligation    . Back surgery  1988  . Cholecystectomy  2004  . Breast surgery      1984  . Tonsillectomy      58 years old  . Colonoscopy  09/07/2011    Procedure: COLONOSCOPY;  Surgeon: Dalia HeadingMark A Jenkins;  Location: [facility];  Service: Gastroenterology;  Laterality: N/A;  . Vaginal hysterectomy    . Bladder suspension       Allergies  Allergies  Allergen Reactions  . Contrast Media [Iodinated Diagnostic  Agents] Itching and Swelling  . Sulfa Drugs Cross Reactors Swelling  . Penicillins Rash     Home Medications  Prior to Admission medications   Medication Sig Start Date End Date Taking? Authorizing Provider  aspirin EC 81 MG EC tablet Take 1 tablet (81 mg total) by mouth daily. 10/22/14  Yes Janetta HoraKathryn R Thompson, PA-C  atorvastatin (LIPITOR) 80 MG tablet Take 1 tablet (80 mg total) by mouth daily at 6 PM. 10/22/14  Yes Janetta HoraKathryn R Thompson, PA-C  Cholecalciferol (VITAMIN D PO) Take 1 tablet by mouth daily.   Yes Historical Provider, MD  diphenhydrAMINE (SOMINEX) 25 MG tablet Take 25 mg by mouth at bedtime as needed for sleep.    Yes Historical Provider, MD  Diphenhydramine-APAP, sleep, (TYLENOL PM EXTRA STRENGTH PO) Take 1 tablet by mouth daily as needed (for pain).    Yes Historical Provider, MD  losartan-hydrochlorothiazide (HYZAAR) 100-12.5 MG per tablet Take 1 tablet by mouth daily.  08/28/14  Yes Historical Provider, MD  metFORMIN (GLUCOPHAGE XR) 500 MG 24 hr tablet Take 4 tablets (2,000 mg total) by mouth daily with breakfast. 10/24/14  Yes Janetta HoraKathryn R Thompson, PA-C  metoprolol tartrate (LOPRESSOR) 25 MG tablet Take 3 tablets (75 mg total) by mouth 2 (two) times daily. 10/22/14  Yes Janetta HoraKathryn R Thompson, PA-C  nitroGLYCERIN (NITROSTAT) 0.4 MG SL tablet Place  1 tablet (0.4 mg total) under the tongue every 5 (five) minutes x 3 doses as needed for chest pain. 10/22/14  Yes Janetta Hora, PA-C  prasugrel (EFFIENT) 10 MG TABS tablet Take 1 tablet (10 mg total) by mouth daily. 10/22/14  Yes Janetta Hora, PA-C  TRULICITY 0.75 MG/0.5ML SOPN Inject 0.75 mg into the skin every 7 (seven) days. On Sunday 09/23/14  Yes Historical Provider, MD    Family History  Family History  Problem Relation Age of Onset  . Cancer Father     Lung  . Heart disease Mother     Valve replacement    No family status information on file.     Social History  History   Social History  . Marital Status:  Married    Spouse Name: N/A    Number of Children: 3  . Years of Education: N/A   Occupational History  .  [employer]   Social History Main Topics  . Smoking status: Never Smoker   . Smokeless tobacco: Not on file  . Alcohol Use: No  . Drug Use: No  . Sexual Activity:    Other Topics Concern  . Not on file   Social History Narrative   Lives at home with husband.        All other systems reviewed and are otherwise negative except as noted above.  Physical Exam  Blood pressure 143/65, pulse 76, temperature 98.3 F (36.8 C), temperature source Oral, resp. rate 20, height 5\' 4"  (1.626 m), weight 276 lb 1 oz (125.22 kg), SpO2 99.00%.  General: Pleasant, NAD Psych: Normal affect. Neuro: Alert and oriented X 3. Moves all extremities spontaneously. HEENT: Normal  Neck: Supple without bruits or JVD. Lungs:  Resp regular and unlabored, CTA. Heart: RRR no s3, s4, or murmurs. Abdomen: Soft, non-tender, non-distended, BS + x 4.  Extremities: No clubbing, cyanosis or edema. DP/PT/Radials 2+ and equal bilaterally.  Labs   Recent Labs  10/21/14 0821 10/23/14 0857  CKTOTAL 207*  --   CKMB 16.5*  --   TROPONINI  --  1.56*   Lab Results  Component Value Date   WBC 10.6* 10/23/2014   HGB 13.1 10/23/2014   HCT 38.7 10/23/2014   MCV 84.3 10/23/2014   PLT 157 10/23/2014    Recent Labs Lab 10/23/14 0857  NA 144  K 3.8  CL 107  CO2 22  BUN 22  CREATININE 0.64  CALCIUM 9.3  GLUCOSE 145*   Lab Results  Component Value Date   CHOL 188 10/19/2014   HDL 47 10/19/2014   LDLCALC 113* 10/19/2014   TRIG 138 10/19/2014      Radiology/Studies  Dg Chest 2 View  10/23/2014   CLINICAL DATA:  Chest pain.  Recent stent.  EXAM: CHEST  2 VIEW  COMPARISON:  10/18/2014.  FINDINGS: Mediastinum and hilar structures are unremarkable. Mild cardiomegaly. Mild pulmonary vascular prominence. Mild interstitial prominence suggesting interstitial edema. Mild basilar atelectasis. No  pleural effusion or pneumothorax. No acute osseous abnormality.  IMPRESSION: 1. Cardiomegaly with very mild interstitial prominence suggesting congestive heart failure with mild interstitial edema.  2.  Mild basilar atelectasis.   Electronically Signed   By: Maisie Fus  Register   On: 10/23/2014 09:57    ECG  HR 69 Sinus rhythm Left ventricular hypertrophy Anterior Q waves, possibly due to LVH Borderline T abnormalities, inferior leads  ASSESSMENT AND PLAN  GEORGIANA SPILLANE is a 58 y.o. female with a history of morbid  obesity, HTN, HLD, DM and CAD s/p very recent NSTEMI s/p DES to mLAD on 10/21/14 who [facility].   Chest pain- -- Will admit for observation tonight -- Troponin 1.56 today. Down from 2.02 on 10/19/14. Hard to tell if this is residual from recent NSTEMI or new. WIll continue to trend.  -- Placed on heparin gtt in ED. Okay to continue. Discontinue if her troponin trending downwards. If starts to go up then [facility].  -- Continue ASA/Effient, Lopressor 75mg  BID, Lipitor 80mg   -- [facility], will plan for 2D ECHO on this admission. Also rule out pericarditis  Hypertension: BP initially difficult to control, but now normal to soft on BB and ARB  -- Continue metoprolol to 75mg  bid and Losartan/HCTZ combo 100/12.5mg    Diabetes: Blood glucose stable. Metformin withheld due to procedure. HgA1c = 6.5.  -- Continue to hold Metformin   Hyperlipidemia: LDL above goal (113), continue atorvastatin 80.   Obesity: Overweight. Importance of healthy diet discussed.     SignedJanetta Hora, THOMPSON, KATHRYN R, PA-C 10/23/2014, 12:28 PM  Pager [phone] I have seen and examined the patient along with THOMPSON, KATHRYN R, PA-C.  I have reviewed the chart, notes and new data.  I agree with PA/NP's note.  Key new complaints: chest pain clearly has a pleuritic component, but is reminiscent of her infarction angina Key examination  changes: no clinical signs CHF, no pericardial rub Key new findings / data: troponin lower than at DC, ECG c/w evolving anteroseptal infarction, similar to discharge ECG. Mild T wave inversion in anterior and inferior leads  PLAN: Observation. Repeat enzymes - if trending back up she should have a heart cath in AM Check echo for LVEF, pericardial effusion.  Thurmon FairMihai Tearah Saulsbury, MD, [facility]ACC [facility] [phone] 10/23/2014, 1:08 PM

## 2014-10-23 NOTE — ED Notes (Signed)
[facility]- Pt had MI Friday, stent placement Monday, d/c from hospital Tuesday (yesterday). Pt having upper left chest pain today radiating into left shoulder. Pt took 2 nitro at home with no relief. Vital signs stable 144/71, 72bpm. Initial pain 6/10, pain now about 4/10.

## 2014-10-24 ENCOUNTER — Encounter (HOSPITAL_COMMUNITY)
Admission: EM | Disposition: A | Discharge status: Home / Self Care | Payer: Self-pay | Source: Home / Self Care | Attending: Cardiovascular Disease

## 2014-10-24 DIAGNOSIS — R079 Chest pain, unspecified: Secondary | ICD-10-CM | POA: Diagnosis present

## 2014-10-24 DIAGNOSIS — Z91041 Radiographic dye allergy status: Secondary | ICD-10-CM | POA: Diagnosis not present

## 2014-10-24 DIAGNOSIS — F329 Major depressive disorder, single episode, unspecified: Secondary | ICD-10-CM | POA: Diagnosis present

## 2014-10-24 DIAGNOSIS — I5030 Unspecified diastolic (congestive) heart failure: Secondary | ICD-10-CM | POA: Diagnosis present

## 2014-10-24 DIAGNOSIS — Z809 Family history of malignant neoplasm, unspecified: Secondary | ICD-10-CM | POA: Diagnosis not present

## 2014-10-24 DIAGNOSIS — Z882 Allergy status to sulfonamides status: Secondary | ICD-10-CM | POA: Diagnosis not present

## 2014-10-24 DIAGNOSIS — I214 Non-ST elevation (NSTEMI) myocardial infarction: Secondary | ICD-10-CM | POA: Diagnosis present

## 2014-10-24 DIAGNOSIS — E785 Hyperlipidemia, unspecified: Secondary | ICD-10-CM | POA: Diagnosis present

## 2014-10-24 DIAGNOSIS — Z880 Allergy status to penicillin: Secondary | ICD-10-CM | POA: Diagnosis not present

## 2014-10-24 DIAGNOSIS — Z955 Presence of coronary angioplasty implant and graft: Secondary | ICD-10-CM | POA: Diagnosis not present

## 2014-10-24 DIAGNOSIS — E119 Type 2 diabetes mellitus without complications: Secondary | ICD-10-CM | POA: Diagnosis present

## 2014-10-24 DIAGNOSIS — E669 Obesity, unspecified: Secondary | ICD-10-CM | POA: Diagnosis present

## 2014-10-24 DIAGNOSIS — I10 Essential (primary) hypertension: Secondary | ICD-10-CM | POA: Diagnosis present

## 2014-10-24 DIAGNOSIS — I25111 Atherosclerotic heart disease of native coronary artery with angina pectoris with documented spasm: Secondary | ICD-10-CM | POA: Diagnosis present

## 2014-10-24 DIAGNOSIS — Z7982 Long term (current) use of aspirin: Secondary | ICD-10-CM | POA: Diagnosis not present

## 2014-10-24 DIAGNOSIS — E876 Hypokalemia: Secondary | ICD-10-CM | POA: Diagnosis present

## 2014-10-24 DIAGNOSIS — Z8249 Family history of ischemic heart disease and other diseases of the circulatory system: Secondary | ICD-10-CM | POA: Diagnosis not present

## 2014-10-24 DIAGNOSIS — Z6841 Body Mass Index (BMI) 40.0 and over, adult: Secondary | ICD-10-CM | POA: Diagnosis not present

## 2014-10-24 DIAGNOSIS — I222 Subsequent non-ST elevation (NSTEMI) myocardial infarction: Secondary | ICD-10-CM | POA: Diagnosis present

## 2014-10-24 HISTORY — PX: LEFT HEART CATHETERIZATION WITH CORONARY ANGIOGRAM: SHX5451

## 2014-10-24 LAB — CBC
HEMATOCRIT: 34.5 % — AB (ref 36.0–46.0)
HEMOGLOBIN: 11.4 g/dL — AB (ref 12.0–15.0)
MCH: 27.1 pg (ref 26.0–34.0)
MCHC: 33 g/dL (ref 30.0–36.0)
MCV: 82.1 fL (ref 78.0–100.0)
Platelets: 166 10*3/uL (ref 150–400)
RBC: 4.2 MIL/uL (ref 3.87–5.11)
RDW: 14.3 % (ref 11.5–15.5)
WBC: 7.7 10*3/uL (ref 4.0–10.5)

## 2014-10-24 LAB — COMPREHENSIVE METABOLIC PANEL
ALT: 39 U/L — ABNORMAL HIGH (ref 0–35)
AST: 19 U/L (ref 0–37)
Albumin: 3.1 g/dL — ABNORMAL LOW (ref 3.5–5.2)
Alkaline Phosphatase: 195 U/L — ABNORMAL HIGH (ref 39–117)
Anion gap: 13 (ref 5–15)
BILIRUBIN TOTAL: 1 mg/dL (ref 0.3–1.2)
BUN: 12 mg/dL (ref 6–23)
CO2: 24 meq/L (ref 19–32)
CREATININE: 0.57 mg/dL (ref 0.50–1.10)
Calcium: 9 mg/dL (ref 8.4–10.5)
Chloride: 103 mEq/L (ref 96–112)
GFR calc Af Amer: >90 mL/min (ref >90)
GLUCOSE: 168 mg/dL — AB (ref 70–99)
Potassium: 3.5 mEq/L — ABNORMAL LOW (ref 3.7–5.3)
Sodium: 140 mEq/L (ref 137–147)
Total Protein: 6.3 g/dL (ref 6.0–8.3)

## 2014-10-24 LAB — HEPARIN LEVEL (UNFRACTIONATED)
HEPARIN UNFRACTIONATED: 0.46 [IU]/mL (ref 0.30–0.70)
Heparin Unfractionated: 0.47 IU/mL (ref 0.30–0.70)

## 2014-10-24 LAB — TROPONIN I: Troponin I: 1.13 ng/mL (ref <0.30)

## 2014-10-24 LAB — LIPID PANEL
Cholesterol: 132 mg/dL (ref 0–200)
HDL: 46 mg/dL (ref >39)
LDL CALC: 61 mg/dL (ref 0–99)
Total CHOL/HDL Ratio: 2.9 RATIO
Triglycerides: 124 mg/dL (ref <150)
VLDL: 25 mg/dL (ref 0–40)

## 2014-10-24 SURGERY — LEFT HEART CATHETERIZATION WITH CORONARY ANGIOGRAM
Anesthesia: LOCAL

## 2014-10-24 MED ORDER — NITROGLYCERIN IN D5W 200-5 MCG/ML-% IV SOLN
0.0000 ug/min | INTRAVENOUS | Status: DC
  Administered 2014-10-24: 5 ug/min via INTRAVENOUS

## 2014-10-24 MED ORDER — MIDAZOLAM HCL 2 MG/2ML IJ SOLN
INTRAMUSCULAR | Status: AC
  Filled 2014-10-24: qty 2

## 2014-10-24 MED ORDER — SODIUM CHLORIDE 0.9 % IJ SOLN: 3.0000 mL | INTRAMUSCULAR | Status: DC | PRN

## 2014-10-24 MED ORDER — SODIUM CHLORIDE 0.9 % IV SOLN: INTRAVENOUS | Status: DC

## 2014-10-24 MED ORDER — ASPIRIN 81 MG PO CHEW: 81.0000 mg | CHEWABLE_TABLET | ORAL | Status: AC

## 2014-10-24 MED ORDER — LIDOCAINE HCL (PF) 1 % IJ SOLN
INTRAMUSCULAR | Status: AC
  Filled 2014-10-24: qty 30

## 2014-10-24 MED ORDER — NITROGLYCERIN 1 MG/10 ML FOR IR/CATH LAB
INTRA_ARTERIAL | Status: AC
  Filled 2014-10-24: qty 10

## 2014-10-24 MED ORDER — METHYLPREDNISOLONE SODIUM SUCC 125 MG IJ SOLR
125.0000 mg | Freq: Once | INTRAMUSCULAR | Status: AC
  Administered 2014-10-24: 125 mg via INTRAVENOUS
  Filled 2014-10-24: qty 2

## 2014-10-24 MED ORDER — VERAPAMIL HCL 2.5 MG/ML IV SOLN
INTRAVENOUS | Status: AC
  Filled 2014-10-24: qty 2

## 2014-10-24 MED ORDER — HEPARIN (PORCINE) IN NACL 100-0.45 UNIT/ML-% IJ SOLN
1800.0000 [IU]/h | INTRAMUSCULAR | Status: DC
  Administered 2014-10-25: 1800 [IU]/h via INTRAVENOUS
  Filled 2014-10-24: qty 250

## 2014-10-24 MED ORDER — SODIUM CHLORIDE 0.9 % IV SOLN
INTRAVENOUS | Status: DC
  Administered 2014-10-24: 11:00:00 via INTRAVENOUS

## 2014-10-24 MED ORDER — SODIUM CHLORIDE 0.9 % IV SOLN: 250.0000 mL | INTRAVENOUS | Status: DC | PRN

## 2014-10-24 MED ORDER — SODIUM CHLORIDE 0.9 % IJ SOLN: 3.0000 mL | Freq: Two times a day (BID) | INTRAMUSCULAR | Status: DC

## 2014-10-24 MED ORDER — FENTANYL CITRATE 0.05 MG/ML IJ SOLN
INTRAMUSCULAR | Status: AC
  Filled 2014-10-24: qty 2

## 2014-10-24 MED ORDER — SODIUM CHLORIDE 0.9 % IV SOLN
1.0000 mL/kg/h | INTRAVENOUS | Status: AC
  Administered 2014-10-24: 1 mL/kg/h via INTRAVENOUS

## 2014-10-24 MED ORDER — HEPARIN SODIUM (PORCINE) 1000 UNIT/ML IJ SOLN
INTRAMUSCULAR | Status: AC
  Filled 2014-10-24: qty 1

## 2014-10-24 MED ORDER — MORPHINE SULFATE 2 MG/ML IJ SOLN: 2.0000 mg | INTRAMUSCULAR | Status: DC | PRN

## 2014-10-24 MED ORDER — ISOSORBIDE MONONITRATE ER 30 MG PO TB24
30.0000 mg | ORAL_TABLET | Freq: Every day | ORAL | Status: DC
  Administered 2014-10-25: 30 mg via ORAL
  Filled 2014-10-24: qty 1

## 2014-10-24 MED ORDER — DIPHENHYDRAMINE HCL 50 MG/ML IJ SOLN
25.0000 mg | Freq: Once | INTRAMUSCULAR | Status: AC
  Administered 2014-10-24: 25 mg via INTRAVENOUS
  Filled 2014-10-24: qty 1

## 2014-10-24 MED ORDER — HEPARIN (PORCINE) IN NACL 2-0.9 UNIT/ML-% IJ SOLN
INTRAMUSCULAR | Status: AC
  Filled 2014-10-24: qty 1500

## 2014-10-24 MED ORDER — NITROGLYCERIN IN D5W 200-5 MCG/ML-% IV SOLN
INTRAVENOUS | Status: AC
  Filled 2014-10-24: qty 250

## 2014-10-24 MED ORDER — FAMOTIDINE IN NACL 20-0.9 MG/50ML-% IV SOLN
20.0000 mg | Freq: Once | INTRAVENOUS | Status: AC
  Administered 2014-10-24: 20 mg via INTRAVENOUS
  Filled 2014-10-24: qty 50

## 2014-10-24 NOTE — CV Procedure (Signed)
CARDIAC CATHETERIZATION  REPORT  NAME:  Andrea Hughes   MRN: [id number] DOB:  11/11/1956   ADMIT DATE: 10/23/2014 Procedure Date: 10/24/2014  INTERVENTIONAL CARDIOLOGIST: Leonie Man, M.D., MS PRIMARY CARE PROVIDER: No PCP Per Patient PRIMARY CARDIOLOGIST: Johnny Bridge, M.D.  PATIENT:  Andrea Hughes is a 58 y.o. female recently discharged after PCI to mid LAD with a drug-eluting stent jailing of the third diagonal branch. She suffered a non-ST elevation MI last Friday 10/23, and stayed the weekend awaiting for PCI. She initially did well post PCI was discharged the following day on the 27th. She then readmitted on the 28th with recurrent anginal chest pain and troponin elevations to 1.8 consistent with non-STEMI. She is now referred for relook cardiac catheterization and possible PCI.  PRE-OPERATIVE DIAGNOSIS:    Non-STEMI  Recent LAD PCI  PROCEDURES PERFORMED:    Left Heart Catheterization with Native Coronary Angiography  via Right Radial Artery   PROCEDURE: The patient was brought to the 2nd [facility] [facility] in the fasting state and prepped and draped in the usual sterile fashion for right radial artery access. A modified Allen's test was performed on the right wrist demonstrating excellent collateral flow for radial access.   Sterile technique was used including antiseptics, cap, gloves, gown, hand hygiene, mask and sheet. Skin prep: Chlorhexidine.   Consent: Risks of procedure as well as the alternatives and risks of each were explained to the (patient/caregiver). Consent for procedure obtained.   Time Out: Verified patient identification, verified procedure, site/side was marked, verified correct patient position, special equipment/implants available, medications/allergies/relevent history reviewed, required imaging and test results available. Performed.  Access:   Right Radial Artery: 6 Fr Sheath -  Seldinger Technique (Angiocath  Micropuncture Kit)  Radial Cocktail - 10 mL; IV Heparin 6000 Units   Left Heart Catheterization: 5 Fr Catheters advanced or exchanged over a Long Exchange Safety J-wire; TIG 4.0 catheter advanced first.  Left & Right Coronary Artery Cineangiography: TIG 4.0 Catheter   LV Hemodynamics: Straight Pigtail Catheter  Sheath removed in the cardiac catheterization lab with VASC Band application for hemostasis.  VASC Band:    1915Hours;  17 mL air  FINDINGS:  Hemodynamics:   Central Aortic Pressure / Mean:  initial pressures were 170/90 mmHg; after radial cocktail and sedation: 134/71/97 mmHg   Left Ventricular Pressure / LVEDP:  133/90/16 mmHg  Left Ventriculography: deferred  Coronary Anatomy:  Dominance: RIGHT  Left Main: Normal caliber vessel that bifurcates into the LAD and almost codominant circumflex. LAD: Large-caliber vessel with 2 proximal diagonal branches there are small in diameter. The previous 99% subtotal occlusion just after D3 are widely patent stent. This does partially jailed the ostium D3, however there is TIMI-3 flow in D3. There may be 50% ostial stenosis and then another 50-60% proximal stenosis in D3. The remainder the LAD is widely patent and reached down around the inferoapex. .  Left Circumflex: Large caliber vessel that promptly bifurcates into a large lateral ramus-like OM that bifurcates in the mid vessel with both branches reaching almost out of the inferolateral Wall/apex. The AV groove circumflex continues distally in bifurcates into 2 branches 1 is probably a second OM last branch is a LPL. Minimal luminal irregularities    RCA: Large caliber, dominant vessel that bifurcates distally into the right posterior descending artery (RPDA ) Right Posterior AV Groove Branch (RPAV). Minimal luminal irregularities. Several large marginal branches and the mid vessel.  RPDA: Large-caliber vessel  reaches almost all the way to the apex. Somewhat tortuous but free of  disease.   RPL Sysytem:The RPAV Begins as a moderate large-caliber vessel bifurcates into RPL 1 and 2 as well as giving off the AV nodal artery. Minimal luminal irregularities  After reviewing the initial angiography, the jailed diagonal. To be very similar to the final shots post PCI. In fact there is probably risk her flow noted in several images.   MEDICATIONS:  Anesthesia:  Local Lidocaine 2 ml  Sedation:  2 mg IV Versed, 50 mcg IV fentanyl ;   Premedication: 125 mg IV Solu-Medrol, 30 mg IV Benadryl, 20 mg IV Pepcid  Omnipaque Contrast: 70 ml  Anticoagulation:  IV Heparin 6000 Units Radial Cocktail : 5 mg Verapamil, 400 mcg NTG, 2 ml 2% Lidocaine in 10 ml NS IC NTG: 200 g 10  PATIENT DISPOSITION:    The patient was transferred to the PACU holding area in a hemodynamicaly stable, chest pain free condition.  The patient tolerated the procedure well, and there were no complications.  EBL:   < 10 ml  The patient was stable before, during, and after the procedure.  POST-OPERATIVE DIAGNOSIS:    Essentially stable appearing LAD stent and jailed D3 with moderate ostial and proximal stenosis in the D3. I suspect that the current elevation is probably related to coronary spasm with mild plaque shift in the diagonal branch. She also had significantly elevated systemic blood pressures upon arrival. Elevated EDP could certainly exacerbate microvascular ischemia.  Otherwise stable coronaries. Following blood pressure reduction, she has moderate LV EDP elevation.  PLAN OF CARE:  Return to nursing unit for standard post radial cath care.  Restart IV heparin to complete another 18 hours based on the positive troponins.   Stop nitroglycerin paste, convert to nitroglycerin just to run overnight and then start Imdur in the morning.  Would consider increasing beta blocker dose to 100 mg twice a day as well as potentially using low-dose amlodipine for short-term antispasm effect in addition  to Imdur.  Also consider switching from HCTZ to low-dose Lasix for mild diastolic heart failure related diuresis..  Certainly if she does have episodes, we could consider balloon angioplasty of the ostium and proximal portion of the small diagonal branch. This is probably at most a 2 mm vessel.   Leonie Man, M.D., M.S. Interventional Cardiologist   Pager # [phone]

## 2014-10-24 NOTE — ED Provider Notes (Signed)
Medical screening examination/treatment/procedure(s) were conducted as a shared visit with non-physician practitioner(s) and myself.  I personally evaluated the patient during the encounter.   EKG Interpretation   Date/Time:  Wednesday October 23 2014 08:49:14 EDT Ventricular Rate:  69 PR Interval:  168 QRS Duration: 97 QT Interval:  428 QTC Calculation: 458 R Axis:   -17 Text Interpretation:  Sinus rhythm Left ventricular hypertrophy Anterior Q  waves, possibly due to LVH Borderline T abnormalities, inferior leads No  significant change since last tracing Confirmed by Rica Heather  MD, Annarae Macnair  ([id number]) on 10/23/2014 8:59:26 AM       Patient with CP s/p stent a few days ago. Troponin elevated but less than 4-5 days prior. Given this, difficult to tell if MI vs dropping troponin. Will cover with heparin, ASA, and admit to cardiology.  Audree CamelScott T Kyani Simkin, MD 10/24/14 [phone]

## 2014-10-24 NOTE — Progress Notes (Signed)
ANTICOAGULATION CONSULT NOTE  Pharmacy Consult for Heparin Indication: chest pain/ACS  Allergies  Allergen Reactions  . Contrast Media [Iodinated Diagnostic Agents] Itching and Swelling  . Sulfa Drugs Cross Reactors Swelling  . Penicillins Rash    Patient Measurements: Height: 5\' 4"  (162.6 cm) Weight: 265 lb 9.6 oz (120.475 kg) IBW/kg (Calculated) : 54.7 Heparin Dosing Weight: 84kg  Vital Signs: Temp: 98.7 F (37.1 C) (10/29 2010) Temp Source: Oral (10/29 1431) BP: 109/64 mmHg (10/29 2157) Pulse Rate: 71 (10/29 2010)  Labs:  Recent Labs  10/22/14 0355  10/23/14 0857 10/23/14 1640 10/23/14 1800 10/23/14 2252 10/24/14 0309 10/24/14 0310 10/24/14 0905  HGB 11.8*  --  13.1  --   --   --   --  11.4*  --   HCT 35.3*  --  38.7  --   --   --   --  34.5*  --   PLT 175  --  157  --   --   --   --  166  --   LABPROT  --   --   --  13.9  --   --   --   --   --   INR  --   --   --  1.06  --   --   --   --   --   HEPARINUNFRC  --   --   --   --  0.13*  --  0.46  --  0.47  CREATININE 0.58  --  0.64  --   --   --   --  0.57  --   TROPONINI  --   < > 1.56* 1.78*  --  1.85* 1.13*  --   --   < > = values in this interval not displayed.  Estimated Creatinine Clearance: 100.4 ml/min (by C-G formula based on Cr of 0.57).   Assessment: 58yo female s/p recent NSTEMI/DES (d/c'd 10/27) with readmission for recurrent chest pain.  Heparin level this AM was therapeutic on 1800 units/hr. Taken to cath this afternoon and is to restart heparin 6 hours after band removal. Band on pace to be removed at midnight. Per Dr. Elissa HeftyHarding's cath note, only to get heparin for 18 more hours for positive troponins.  Goal of Therapy:  Heparin level 0.3-0.7 units/ml Monitor platelets by anticoagulation protocol: Yes   Plan:  1. Resume heparin at 1800 units/hr at 0600 2. Heparin level 6 hours after resumption 3. Stop heparin in 18 hours after resumption per Dr. Elissa HeftyHarding's note 4. Follow for s/s bleeding  and any further intervention plans  Braylee Lal D. Daneesha Quinteros, PharmD, BCPS Clinical Pharmacist Pager: [phone] 10/24/2014 10:33 PM

## 2014-10-24 NOTE — Progress Notes (Signed)
Inpatient Diabetes Program Recommendations  AACE/ADA: New Consensus Statement on Inpatient Glycemic Control (2013)  Target Ranges:  Prepandial:   less than 140 mg/dL      Peak postprandial:   less than 180 mg/dL (1-2 hours)      Critically ill patients:  140 - 180 mg/dL     Results for Lamona CurlRICHMOND, Aishia W (MRN [id number]) as of 10/24/2014 14:07  Ref. Range 10/24/2014 03:10  Glucose Latest Range: 70-99 mg/dL 409168 (H)     Admitted with CP.  History of DM, HTN, CAD, MI.  Home DM Meds: Metformin 2000 mg daily       Trulicity (GLP-1 agonist) 0.75 mg once weekly   Current Insulin Orders: None    MD- Please place order for Novolog Sensitive SSI tid ac + HS (while home DM Medications are on hold)    Will follow Ambrose FinlandJeannine Johnston Aubrianna Orchard RN, MSN, CDE Diabetes Coordinator Inpatient Diabetes Program Team Pager: [phone] (8a-10p)

## 2014-10-24 NOTE — Progress Notes (Signed)
UR Completed Chinedum Vanhouten Graves-Bigelow, RN,BSN [phone]  

## 2014-10-24 NOTE — H&P (View-Only) (Signed)
. Patient Profile: Andrea Hughes is a 58 y.o. female with a history of morbid obesity, HTN, HLD, DM and CAD s/p very recent NSTEMI s/p DES to mLAD on 10/21/14 and discharged 10/22/14 who [facility] with recurrent chest pain.    Subjective: Pt reports episode of chest tightness with neck and jaw pain yesterday prior to arrival.  She is currently CP free on IV heparin and nitro patch.   Objective: Vital signs in last 24 hours: Temp:  [98 F (36.7 C)-98.4 F (36.9 C)] 98.4 F (36.9 C) (10/29 0544) Pulse Rate:  [68-87] 69 (10/29 0544) Resp:  [14-23] 20 (10/29 0545) BP: (117-144)/(52-67) 131/66 mmHg (10/29 0545) SpO2:  [94 %-100 %] 99 % (10/29 0544) Weight:  [265 lb 9.6 oz (120.475 kg)-276 lb 1 oz (125.22 kg)] 265 lb 9.6 oz (120.475 kg) (10/29 0544) Last BM Date: 10/22/14  Intake/Output from previous day: 10/28 0701 - 10/29 0700 In: 240 [P.O.:240] Out: 2300 [Urine:2300] Intake/Output this shift:    Medications Current Facility-Administered Medications  Medication Dose Route Frequency Provider Last Rate Last Dose  . 0.9 %  sodium chloride infusion  250 mL Intravenous PRN Janetta HoraKathryn R Thompson, PA-C      . acetaminophen (TYLENOL) tablet 650 mg  650 mg Oral Q4H PRN Janetta HoraKathryn R Thompson, PA-C      . aspirin EC tablet 81 mg  81 mg Oral Daily Janetta HoraKathryn R Thompson, PA-C      . atorvastatin (LIPITOR) tablet 80 mg  80 mg Oral q1800 Janetta HoraKathryn R Thompson, PA-C   80 mg at 10/23/14 2115  . diphenhydrAMINE (BENADRYL) capsule 25 mg  25 mg Oral QHS PRN Janetta HoraKathryn R Thompson, PA-C      . heparin ADULT infusion 100 units/mL (25000 units/250 mL)  1,800 Units/hr Intravenous Continuous Lauren Bajbus, RPH 18 mL/hr at 10/23/14 2358 1,800 Units/hr at 10/23/14 2358  . losartan (COZAAR) tablet 100 mg  100 mg Oral Daily Janetta HoraKathryn R Thompson, PA-C   100 mg at 10/23/14 1501   And  . hydrochlorothiazide (MICROZIDE) capsule 12.5 mg  12.5 mg Oral Daily Janetta HoraKathryn R Thompson, PA-C   12.5 mg at 10/23/14 1501  .  metoprolol tartrate (LOPRESSOR) tablet 75 mg  75 mg Oral BID Janetta HoraKathryn R Thompson, PA-C   75 mg at 10/23/14 2114  . morphine 2 MG/ML injection 2 mg  2 mg Intravenous Q2H PRN Ok Anishristopher R Berge, NP   2 mg at 10/23/14 1502  . nitroGLYCERIN (NITROGLYN) 2 % ointment 0.5 inch  0.5 inch Topical 4 times per day Ok Anishristopher R Berge, NP   0.5 inch at 10/24/14 0626  . nitroGLYCERIN (NITROSTAT) SL tablet 0.4 mg  0.4 mg Sublingual Q5 Min x 3 PRN Janetta HoraKathryn R Thompson, PA-C      . ondansetron [facility](ZOFRAN) injection 4 mg  4 mg Intravenous Q6H PRN Janetta HoraKathryn R Thompson, PA-C      . prasugrel (EFFIENT) tablet 10 mg  10 mg Oral Daily Janetta HoraKathryn R Thompson, PA-C   10 mg at 10/23/14 1501  . sodium chloride 0.9 % injection 3 mL  3 mL Intravenous Q12H Janetta HoraKathryn R Thompson, PA-C      . sodium chloride 0.9 % injection 3 mL  3 mL Intravenous PRN Janetta HoraKathryn R Thompson, PA-C        PE: General appearance: alert, cooperative, no distress and morbidly obese Lungs: clear to auscultation bilaterally Heart: regular rate and rhythm Abdomen: soft, non-tender; bowel sounds normal; no masses,  no organomegaly Extremities: no LEE Pulses:  2+ and symmetric Skin: warm and dry Neurologic: Grossly normal  Lab Results:   Recent Labs  10/22/14 0355 10/23/14 0857 10/24/14 0310  WBC 11.7* 10.6* 7.7  HGB 11.8* 13.1 11.4*  HCT 35.3* 38.7 34.5*  PLT 175 157 166   BMET  Recent Labs  10/22/14 0355 10/23/14 0857 10/24/14 0310  NA 144 144 140  K 3.8 3.8 3.5*  CL 109 107 103  CO2 21 22 24   GLUCOSE 186* 145* 168*  BUN 11 22 12   CREATININE 0.58 0.64 0.57  CALCIUM 9.1 9.3 9.0   PT/INR  Recent Labs  10/23/14 1640  LABPROT 13.9  INR 1.06   Cholesterol  Recent Labs  10/24/14 0310  CHOL 132   Cardiac Panel (last 3 results)  Recent Labs  10/23/14 1640 10/23/14 2252 10/24/14 0309  TROPONINI 1.78* 1.85* 1.13*    Assessment/Plan  Active Problems:   Chest pain  1. NSTEMI: currently CP free on IV heparin and nitro patch.  Cardiac enzymes yesterday demonstrated upward trend from 1.56 -->1.78 -->1.85, then back down to 1.13. [facility] mid LAD stent. Continue IV heparin, ASA, Effient, BB and statin.   2.  Hypokalemia: K is 3.5. Will order supplemental K-Dur.     LOS: 1 day    Brittainy M. Delmer IslamSimmons, PA-C 10/24/2014 8:55 AM  I have personally seen and examined this patient with Andrea LisBrittainy Simmons, PA-c. I agree with the assessment and plan as outlined above. She had a 3.0 x 18 mm Xience DES placed in the mid LAD 10/21/14 by Dr. Herbie BaltimoreHarding. Now readmitted with severe chest pain, controlled on IV NTG and IV heparin. She is on ASA and Effient and has not missed any of this therapy. Now with elevated troponin that trended up after admission. I have reviewed the films and it is possible that her presentation is c/w poor flow down the small diagonal branch but I think she will need relook cath to make sure the LAD stent is patent without mechanical issues. Will keep NPO. Will add onto the cath schedule for late afternoon cath. Continue IV NTG, IV heparin, ASA, Effient, beta blocker, ARB, statin.   MCALHANY,CHRISTOPHER 10/24/2014 9:33 AM

## 2014-10-24 NOTE — Progress Notes (Signed)
Pt troponin went up to 1.85. Pt denies any CP.  Iv heparin running. Md on call paged. Will cont to monitor pt.

## 2014-10-24 NOTE — Progress Notes (Signed)
. Patient Profile: Andrea Hughes is a 58 y.o. female with a history of morbid obesity, HTN, HLD, DM and CAD s/p very recent NSTEMI s/p DES to mLAD on 10/21/14 and discharged 10/22/14 who [facility] with recurrent chest pain.    Subjective: Pt reports episode of chest tightness with neck and jaw pain yesterday prior to arrival.  She is currently CP free on IV heparin and nitro patch.   Objective: Vital signs in last 24 hours: Temp:  [98 F (36.7 C)-98.4 F (36.9 C)] 98.4 F (36.9 C) (10/29 0544) Pulse Rate:  [68-87] 69 (10/29 0544) Resp:  [14-23] 20 (10/29 0545) BP: (117-144)/(52-67) 131/66 mmHg (10/29 0545) SpO2:  [94 %-100 %] 99 % (10/29 0544) Weight:  [265 lb 9.6 oz (120.475 kg)-276 lb 1 oz (125.22 kg)] 265 lb 9.6 oz (120.475 kg) (10/29 0544) Last BM Date: 10/22/14  Intake/Output from previous day: 10/28 0701 - 10/29 0700 In: 240 [P.O.:240] Out: 2300 [Urine:2300] Intake/Output this shift:    Medications Current Facility-Administered Medications  Medication Dose Route Frequency Provider Last Rate Last Dose  . 0.9 %  sodium chloride infusion  250 mL Intravenous PRN Janetta HoraKathryn R Thompson, PA-C      . acetaminophen (TYLENOL) tablet 650 mg  650 mg Oral Q4H PRN Janetta HoraKathryn R Thompson, PA-C      . aspirin EC tablet 81 mg  81 mg Oral Daily Janetta HoraKathryn R Thompson, PA-C      . atorvastatin (LIPITOR) tablet 80 mg  80 mg Oral q1800 Janetta HoraKathryn R Thompson, PA-C   80 mg at 10/23/14 2115  . diphenhydrAMINE (BENADRYL) capsule 25 mg  25 mg Oral QHS PRN Janetta HoraKathryn R Thompson, PA-C      . heparin ADULT infusion 100 units/mL (25000 units/250 mL)  1,800 Units/hr Intravenous Continuous Lauren Bajbus, RPH 18 mL/hr at 10/23/14 2358 1,800 Units/hr at 10/23/14 2358  . losartan (COZAAR) tablet 100 mg  100 mg Oral Daily Janetta HoraKathryn R Thompson, PA-C   100 mg at 10/23/14 1501   And  . hydrochlorothiazide (MICROZIDE) capsule 12.5 mg  12.5 mg Oral Daily Janetta HoraKathryn R Thompson, PA-C   12.5 mg at 10/23/14 1501  .  metoprolol tartrate (LOPRESSOR) tablet 75 mg  75 mg Oral BID Janetta HoraKathryn R Thompson, PA-C   75 mg at 10/23/14 2114  . morphine 2 MG/ML injection 2 mg  2 mg Intravenous Q2H PRN Ok Anishristopher R Berge, NP   2 mg at 10/23/14 1502  . nitroGLYCERIN (NITROGLYN) 2 % ointment 0.5 inch  0.5 inch Topical 4 times per day Ok Anishristopher R Berge, NP   0.5 inch at 10/24/14 0626  . nitroGLYCERIN (NITROSTAT) SL tablet 0.4 mg  0.4 mg Sublingual Q5 Min x 3 PRN Janetta HoraKathryn R Thompson, PA-C      . ondansetron [facility](ZOFRAN) injection 4 mg  4 mg Intravenous Q6H PRN Janetta HoraKathryn R Thompson, PA-C      . prasugrel (EFFIENT) tablet 10 mg  10 mg Oral Daily Janetta HoraKathryn R Thompson, PA-C   10 mg at 10/23/14 1501  . sodium chloride 0.9 % injection 3 mL  3 mL Intravenous Q12H Janetta HoraKathryn R Thompson, PA-C      . sodium chloride 0.9 % injection 3 mL  3 mL Intravenous PRN Janetta HoraKathryn R Thompson, PA-C        PE: General appearance: alert, cooperative, no distress and morbidly obese Lungs: clear to auscultation bilaterally Heart: regular rate and rhythm Abdomen: soft, non-tender; bowel sounds normal; no masses,  no organomegaly Extremities: no LEE Pulses:  2+ and symmetric Skin: warm and dry Neurologic: Grossly normal  Lab Results:   Recent Labs  10/22/14 0355 10/23/14 0857 10/24/14 0310  WBC 11.7* 10.6* 7.7  HGB 11.8* 13.1 11.4*  HCT 35.3* 38.7 34.5*  PLT 175 157 166   BMET  Recent Labs  10/22/14 0355 10/23/14 0857 10/24/14 0310  NA 144 144 140  K 3.8 3.8 3.5*  CL 109 107 103  CO2 21 22 24  GLUCOSE 186* 145* 168*  BUN 11 22 12  CREATININE 0.58 0.64 0.57  CALCIUM 9.1 9.3 9.0   PT/INR  Recent Labs  10/23/14 1640  LABPROT 13.9  INR 1.06   Cholesterol  Recent Labs  10/24/14 0310  CHOL 132   Cardiac Panel (last 3 results)  Recent Labs  10/23/14 1640 10/23/14 2252 10/24/14 0309  TROPONINI 1.78* 1.85* 1.13*    Assessment/Plan  Active Problems:   Chest pain  1. NSTEMI: currently CP free on IV heparin and nitro patch.  Cardiac enzymes yesterday demonstrated upward trend from 1.56 -->1.78 -->1.85, then back down to 1.13. Would recommend repeat [facility] to assess status of recently placed mid LAD stent. Continue IV heparin, ASA, Effient, BB and statin.   2.  Hypokalemia: K is 3.5. Will order supplemental K-Dur.     LOS: 1 day    Brittainy M. Simmons, PA-C 10/24/2014 8:55 AM  I have personally seen and examined this patient with Brittainy Simmons, PA-c. I agree with the assessment and plan as outlined above. She had a 3.0 x 18 mm Xience DES placed in the mid LAD 10/21/14 by Dr. Harding. Now readmitted with severe chest pain, controlled on IV NTG and IV heparin. She is on ASA and Effient and has not missed any of this therapy. Now with elevated troponin that trended up after admission. I have reviewed the films and it is possible that her presentation is c/w poor flow down the small diagonal branch but I think she will need relook cath to make sure the LAD stent is patent without mechanical issues. Will keep NPO. Will add onto the cath schedule for late afternoon cath. Continue IV NTG, IV heparin, ASA, Effient, beta blocker, ARB, statin.   MCALHANY,CHRISTOPHER 10/24/2014 9:33 AM  

## 2014-10-24 NOTE — Progress Notes (Signed)
ANTICOAGULATION CONSULT NOTE - Follow Up Consult  Pharmacy Consult for Heparin Indication: chest pain/ACS  Allergies  Allergen Reactions  . Contrast Media [Iodinated Diagnostic Agents] Itching and Swelling  . Sulfa Drugs Cross Reactors Swelling  . Penicillins Rash    Patient Measurements: Height: 5\' 4"  (162.6 cm) Weight: 265 lb 9.6 oz (120.475 kg) IBW/kg (Calculated) : 54.7 Heparin Dosing Weight:   Vital Signs: Temp: 98.4 F (36.9 C) (10/29 0544) Temp Source: Oral (10/29 0544) BP: 131/66 mmHg (10/29 0545) Pulse Rate: 69 (10/29 0544)  Labs:  Recent Labs  10/22/14 0355  10/23/14 0857 10/23/14 1640 10/23/14 1800 10/23/14 2252 10/24/14 0309 10/24/14 0310 10/24/14 0905  HGB 11.8*  --  13.1  --   --   --   --  11.4*  --   HCT 35.3*  --  38.7  --   --   --   --  34.5*  --   PLT 175  --  157  --   --   --   --  166  --   LABPROT  --   --   --  13.9  --   --   --   --   --   INR  --   --   --  1.06  --   --   --   --   --   HEPARINUNFRC  --   --   --   --  0.13*  --  0.46  --  0.47  CREATININE 0.58  --  0.64  --   --   --   --  0.57  --   TROPONINI  --   < > 1.56* 1.78*  --  1.85* 1.13*  --   --   < > = values in this interval not displayed.  Estimated Creatinine Clearance: 100.4 ml/min (by C-G formula based on Cr of 0.57).   Medications:  Scheduled:  . aspirin EC  81 mg Oral Daily  . atorvastatin  80 mg Oral q1800  . losartan  100 mg Oral Daily   And  . hydrochlorothiazide  12.5 mg Oral Daily  . metoprolol  75 mg Oral BID  . nitroGLYCERIN  0.5 inch Topical 4 times per day  . prasugrel  10 mg Oral Daily  . sodium chloride  3 mL Intravenous Q12H    Assessment: 58yo female s/p recent NSTEMI/DES (d/c'd 10/27) with readmission for recurrent chest pain, for cath late this afternoon.  Heparin level this AM is therapeutic on 1800 units/hr, Hg 11.5 (~stable) and pltc wnl.  No bleeding noted.  Goal of Therapy:  Heparin level 0.3-0.7 units/ml Monitor platelets by  anticoagulation protocol: Yes   Plan:  Continue Heparin 1800 units/hr F/U after cath  Marisue HumbleKendra Devesh Monforte, PharmD Clinical Pharmacist [facility]

## 2014-10-24 NOTE — Progress Notes (Signed)
ANTICOAGULATION CONSULT NOTE - Follow Up Consult  Pharmacy Consult for heparin Indication: chest pain/ACS  Labs:  Recent Labs  10/21/14 0821  10/22/14 0355 10/23/14 0857 10/23/14 1640 10/23/14 1800 10/23/14 2252 10/24/14 0309 10/24/14 0310  HGB  --   < > 11.8* 13.1  --   --   --   --  11.4*  HCT  --   --  35.3* 38.7  --   --   --   --  34.5*  PLT  --   --  175 157  --   --   --   --  166  LABPROT  --   --   --   --  13.9  --   --   --   --   INR  --   --   --   --  1.06  --   --   --   --   HEPARINUNFRC  --   --   --   --   --  0.13*  --  0.46  --   CREATININE  --   --  0.58 0.64  --   --   --   --   --   CKTOTAL 207*  --   --   --   --   --   --   --   --   CKMB 16.5*  --   --   --   --   --   --   --   --   TROPONINI  --   --   --  1.56* 1.78*  --  1.85*  --   --   < > = values in this interval not displayed.   Assessment/Plan:  58yo female therapeutic on heparin after rate increase. Will continue gtt at current rate and confirm stable with additional level.   Vernard GamblesVeronda Breanne Olvera, PharmD, BCPS  10/24/2014,3:36 AM

## 2014-10-24 NOTE — Interval H&P Note (Signed)
History and Physical Interval Note:  10/24/2014 6:13 PM  Andrea Hughes  has presented today for surgery, with the diagnosis NSTEMI - post PCI. S/p PCI of LAD on 10/26 - discharged on 10/27.  Readmitted on 10/28 with recurrent angina & mild troponin elevation.  Referred for re-look angiography.  Likely culprit is jailed D3 branch.   The various methods of treatment have been discussed with the patient and family. After consideration of risks, benefits and other options for treatment, the patient has consented to  Procedure(s): LEFT HEART CATHETERIZATION WITH CORONARY ANGIOGRAM (N/A) +/- PCI as a surgical intervention .  The patient's history has been reviewed, patient examined, no change in status, stable for surgery.  I have reviewed the patient's chart and labs.  Questions were answered to the patient's satisfaction.    The procedure with Risks/Benefits/Alternatives and Indications was reviewed with the patient.  All questions were answered.    Risks / Complications include, but not limited to: Death, MI, CVA/TIA, VF/VT (with defibrillation), Bradycardia (need for temporary pacer placement), contrast induced nephropathy, bleeding / bruising / hematoma / pseudoaneurysm, vascular or coronary injury (with possible emergent CT or Vascular Surgery), adverse medication reactions, infection.  Additional risks for radiation exposure were also discussed - skin burns & possible cancer.  The patient voices understanding and agree to proceed.    Cath Lab Visit (complete for each Cath Lab visit)  Clinical Evaluation Leading to the Procedure:   ACS: Yes.    Non-ACS:    Anginal Classification: CCS IV  Anti-ischemic medical therapy: Maximal Therapy (2 or more classes of medications)  Non-Invasive Test Results: No non-invasive testing performed  Prior CABG: No previous CABG   Andrea Hughes

## 2014-10-25 ENCOUNTER — Other Ambulatory Visit: Payer: Self-pay

## 2014-10-25 ENCOUNTER — Encounter (HOSPITAL_COMMUNITY): Payer: Self-pay | Admitting: Physician Assistant

## 2014-10-25 DIAGNOSIS — I2510 Atherosclerotic heart disease of native coronary artery without angina pectoris: Secondary | ICD-10-CM

## 2014-10-25 DIAGNOSIS — I214 Non-ST elevation (NSTEMI) myocardial infarction: Secondary | ICD-10-CM

## 2014-10-25 DIAGNOSIS — Z9861 Coronary angioplasty status: Secondary | ICD-10-CM

## 2014-10-25 LAB — CBC
HCT: 37.1 % (ref 36.0–46.0)
HEMOGLOBIN: 12.9 g/dL (ref 12.0–15.0)
MCH: 27.6 pg (ref 26.0–34.0)
MCHC: 34.8 g/dL (ref 30.0–36.0)
MCV: 79.4 fL (ref 78.0–100.0)
PLATELETS: 182 10*3/uL (ref 150–400)
RBC: 4.67 MIL/uL (ref 3.87–5.11)
RDW: 13.5 % (ref 11.5–15.5)
WBC: 5.9 10*3/uL (ref 4.0–10.5)

## 2014-10-25 MED ORDER — METFORMIN HCL ER 500 MG PO TB24
2000.0000 mg | ORAL_TABLET | Freq: Every day | ORAL | Status: DC
Start: 2014-10-25 — End: 2023-09-01

## 2014-10-25 MED ORDER — METOPROLOL TARTRATE 100 MG PO TABS: 100.0000 mg | ORAL_TABLET | Freq: Two times a day (BID) | ORAL | Status: DC

## 2014-10-25 MED ORDER — ISOSORBIDE MONONITRATE ER 30 MG PO TB24: 30.0000 mg | ORAL_TABLET | Freq: Every day | ORAL | Status: DC

## 2014-10-25 MED ORDER — METOPROLOL TARTRATE 100 MG PO TABS
100.0000 mg | ORAL_TABLET | Freq: Two times a day (BID) | ORAL | Status: DC
  Administered 2014-10-25: 100 mg via ORAL
  Filled 2014-10-25: qty 1

## 2014-10-25 NOTE — Progress Notes (Signed)
Oxygen saturation pre ambulation at 97% on room Air. On Ambulation O2 saturation had ranged between 96 - 100 % on room Air. No c/o of shortness of breath nor of chest pain while ambulating. PAC aware.

## 2014-10-25 NOTE — Progress Notes (Signed)
Patient Name: Andrea CurlRobin W Loyal Date of Encounter: 10/25/2014  Principal Problem:   NSTEMI (non-ST elevated myocardial infarction) Active Problems:   Hyperlipidemia with target LDL less than 70   Essential hypertension   CAD S/P percutaneous coronary angioplasty: DES stent in LAD   Chest pain    Patient Profile: 58 yo female w/ hx PCI LAD 10/26 w/ NSTEMI (peak trop 2.02), morbid obesity, HTN, HLD, DM. D/C 10/27, returned w/ chest pain, ez trended up, cath 10/29 w/ results: POST-OPERATIVE DIAGNOSIS:  Essentially stable appearing LAD stent and jailed D3 with moderate ostial and proximal stenosis in the D3. I suspect that the current elevation is probably related to coronary spasm with mild plaque shift in the diagonal branch. She also had significantly elevated systemic blood pressures upon arrival. Elevated EDP could certainly exacerbate microvascular ischemia.  Otherwise stable coronaries. Following blood pressure reduction, she has moderate LV EDP elevation. PLAN OF CARE:  Return to nursing unit for standard post radial cath care. Restart IV heparin to complete another 18 hours based on the positive troponins.  Stop nitroglycerin paste, convert to nitroglycerin just to run overnight and then start Imdur in the morning.  Would consider increasing beta blocker dose to 100 mg twice a day as well as potentially using low-dose amlodipine for short-term antispasm effect in addition to Imdur.  Also consider switching from HCTZ to low-dose Lasix for mild diastolic heart failure related diuresis.. Certainly if she does have episodes, we could consider balloon angioplasty of the ostium and proximal portion of the small diagonal branch. This is probably at most a 2 mm vessel.   SUBJECTIVE: No chest pain overnight, understands cath results, feels she can go home today.  OBJECTIVE Filed Vitals:   10/25/14 0050 10/25/14 0150 10/25/14 0500 10/25/14 0650  BP: 111/49 114/55 118/65 128/65    Pulse: 68 64 72 57  Temp:   98.6 F (37 C)   TempSrc:   Oral   Resp: 17 14 14 15   Height:      Weight:   262 lb 8 oz (119.069 kg)   SpO2: 98% 95% 98% 99%   No intake or output data in the 24 hours ending 10/25/14 0805 Filed Weights   10/23/14 1447 10/24/14 0544 10/25/14 0500  Weight: 275 lb (124.739 kg) 265 lb 9.6 oz (120.475 kg) 262 lb 8 oz (119.069 kg)    PHYSICAL EXAM General: Well developed, well nourished, female in no acute distress. Head: Normocephalic, atraumatic.  Neck: Supple without bruits, JVD not elevated Lungs:  Resp regular and unlabored, CTA. Heart: RRR, S1, S2, no S3, S4, or murmur; no rub. Abdomen: Soft, non-tender, non-distended, BS + x 4.  Extremities: No clubbing, cyanosis, no edema. Right radial cath site with ecchymosis, small amt edema, no hematoma Neuro: Alert and oriented X 3. Moves all extremities spontaneously. Psych: Normal affect.  LABS: CBC: Recent Labs  10/24/14 0310 10/25/14 0325  WBC 7.7 5.9  HGB 11.4* 12.9  HCT 34.5* 37.1  MCV 82.1 79.4  PLT 166 182   INR: Recent Labs  10/23/14 1640  INR 1.06   Basic Metabolic Panel: Recent Labs  10/23/14 0857 10/24/14 0310  NA 144 140  K 3.8 3.5*  CL 107 103  CO2 22 24  GLUCOSE 145* 168*  BUN 22 12  CREATININE 0.64 0.57  CALCIUM 9.3 9.0   Liver Function Tests: Recent Labs  10/24/14 0310  AST 19  ALT 39*  ALKPHOS 195*  BILITOT 1.0  PROT 6.3  ALBUMIN 3.1*   Cardiac Enzymes: Recent Labs  10/23/14 1640 10/23/14 2252 10/24/14 0309  TROPONINI 1.78* 1.85* 1.13*   Fasting Lipid Panel: Recent Labs  10/24/14 0310  CHOL 132  HDL 46  LDLCALC 61  TRIG 124  CHOLHDL 2.9   TELE:  SR, OCC PACs.  Radiology/Studies: Dg Chest 2 View 10/23/2014   CLINICAL DATA:  Chest pain.  Recent stent.  EXAM: CHEST  2 VIEW  COMPARISON:  10/18/2014.  FINDINGS: Mediastinum and hilar structures are unremarkable. Mild cardiomegaly. Mild pulmonary vascular prominence. Mild interstitial  prominence suggesting interstitial edema. Mild basilar atelectasis. No pleural effusion or pneumothorax. No acute osseous abnormality.  IMPRESSION: 1. Cardiomegaly with very mild interstitial prominence suggesting congestive heart failure with mild interstitial edema.  2.  Mild basilar atelectasis.   Electronically Signed   By: Maisie Fushomas  Register   On: 10/23/2014 09:57     Current Medications:  . aspirin EC  81 mg Oral Daily  . atorvastatin  80 mg Oral q1800  . losartan  100 mg Oral Daily   And  . hydrochlorothiazide  12.5 mg Oral Daily  . isosorbide mononitrate  30 mg Oral Daily  . metoprolol  75 mg Oral BID  . prasugrel  10 mg Oral Daily  . sodium chloride  3 mL Intravenous Q12H  . sodium chloride  3 mL Intravenous Q12H   . heparin 1,800 Units/hr (10/25/14 [id number])  . nitroGLYCERIN 5 mcg/min (10/24/14 2109)    ASSESSMENT AND PLAN: Principal Problem:   NSTEMI (non-ST elevated myocardial infarction): cath results above, initial trial medical therapy recommended. OK to ambulate and see how tolerated. Imdur added, on ASA, BB, statin, Effient  Active Problems:   Hyperlipidemia with target LDL less than 70 - continue Lipitor 80 mg    Essential hypertension - SBP 110s-140s since admit, DH note rec change HCTZ to Lasix, increase metoprolol 75 mg bid --> 100 mg bid, and adding amlodipine for anti-spasmodic/anti-anginal effect. Imdur also new, MD advise med changes.     CAD S/P percutaneous coronary angioplasty: DES stent in LAD - see above    Chest pain - see above,  Plan - ambulate, plan d/c if does well.  Signed, Theodore Demarkhonda Barrett , PA-C 8:05 AM 10/25/2014 Patient seen and examined and history reviewed. Agree with above findings and plan. Feeling very well today. No chest pain or dyspnea. Lungs clear. CV exam without gallop or murmur. BP well controlled. Weight down significantly with diuresis. Will DC IV heparin and Ntg. Begin Imdur 30 mg daily. Increase metoprolol to 100 mg bid. Continue  losartan HCT. Ambulate in halls today and plan on DC today if no angina.  Peter SwazilandJordan, MDFACC 10/25/2014 8:52 AM

## 2014-10-25 NOTE — Discharge Summary (Signed)
CARDIOLOGY DISCHARGE SUMMARY   Patient ID: Andrea Hughes MRN: [id number] DOB/AGE: 03-19-1956 58 y.o.  Admit date: 10/23/2014 Discharge date: 10/25/2014  PCP: No PCP Per Patient Primary Cardiologist: Dr. Diona Browner  Primary Discharge Diagnosis:  NSTEMI (non-ST elevated myocardial infarction)  Secondary Discharge Diagnosis:    Hyperlipidemia with target LDL less than 70   Essential hypertension   CAD S/P percutaneous coronary angioplasty: DES stent in LAD   Hypokalemia  Procedures: Left heart catheterization with native coronary angiography via right radial artery  Hospital Course: Andrea Hughes is a 58 y.o. female with a history of morbid obesity, HTN, HLD, and DM. She was admitted 10/23 with a non-STEMI, peak troponin 2.02. Catheterization 10/26 resulted in PCI to the mid LAD with a drug-eluting stent, jailing the third diagonal branch. She was discharged on 10/27.  She had recurrent chest pain on 10/28 and was readmitted. She was treated medically with improvement in her symptoms but her troponin increased, consistent with another non-STEMI. Peak troponin this admission was 1.85. She was taken back to the cath lab on 10/29, results are below. Medical therapy was recommended as the diagonal 3 has an ostial stenosis but it is a very small vessel and partly jailed by the LAD stent. She was held overnight and hydrated.  Her metoprolol was increased for better blood pressure and heart rate control. She had Imdur added to her medication regimen and was tolerating the medication changes.She was noted to have some hypokalemia and this was supplemented. She was continued on her other previous medications including aspirin, high-dose statin and Effient.   She was ambulating without chest pain or shortness of breath and her renal function was stable post cath. No further inpatient workup was indicated and she is considered stable for discharge, to follow up as an outpatient.   Labs:   Lab Results  Component Value Date   WBC 5.9 10/25/2014   HGB 12.9 10/25/2014   HCT 37.1 10/25/2014   MCV 79.4 10/25/2014   PLT 182 10/25/2014     Recent Labs Lab 10/24/14 0310  NA 140  K 3.5*  CL 103  CO2 24  BUN 12  CREATININE 0.57  CALCIUM 9.0  PROT 6.3  BILITOT 1.0  ALKPHOS 195*  ALT 39*  AST 19  GLUCOSE 168*    Recent Labs  10/23/14 1640 10/23/14 2252 10/24/14 0309  TROPONINI 1.78* 1.85* 1.13*   Lipid Panel     Component Value Date/Time   CHOL 132 10/24/2014 0310   TRIG 124 10/24/2014 0310   HDL 46 10/24/2014 0310   CHOLHDL 2.9 10/24/2014 0310   VLDL 25 10/24/2014 0310   LDLCALC 61 10/24/2014 0310    Recent Labs  10/23/14 1640  INR 1.06      Radiology: Dg Chest 2 View 10/23/2014   CLINICAL DATA:  Chest pain.  Recent stent.  EXAM: CHEST  2 VIEW  COMPARISON:  10/18/2014.  FINDINGS: Mediastinum and hilar structures are unremarkable. Mild cardiomegaly. Mild pulmonary vascular prominence. Mild interstitial prominence suggesting interstitial edema. Mild basilar atelectasis. No pleural effusion or pneumothorax. No acute osseous abnormality.  IMPRESSION: 1. Cardiomegaly with very mild interstitial prominence suggesting congestive heart failure with mild interstitial edema.  2.  Mild basilar atelectasis.   Electronically Signed   By: Maisie Fus  Register   On: 10/23/2014 09:57    Cardiac Cath: 10/24/2014 Left Ventricular Pressure / LVEDP: 130/6/12 mmHg POST-OPERATIVE DIAGNOSIS:  Essentially stable appearing LAD stent and jailed D3 with moderate  ostial and proximal stenosis in the D3. I suspect that the current elevation is probably related to coronary spasm with mild plaque shift in the diagonal branch. She also had significantly elevated systemic blood pressures upon arrival. Elevated EDP could certainly exacerbate microvascular ischemia.  Otherwise stable coronaries. Following blood pressure reduction, she has moderate LV EDP elevation. PLAN OF CARE:  Return to  nursing unit for standard post radial cath care.  Restart IV heparin to complete another 18 hours based on the positive troponins.  Stop nitroglycerin paste, convert to nitroglycerin just to run overnight and then start Imdur in the morning.  Would consider increasing beta blocker dose to 100 mg twice a day as well as potentially using low-dose amlodipine for short-term antispasm effect in addition to Imdur.  Also consider switching from HCTZ to low-dose Lasix for mild diastolic heart failure related diuresis.. Certainly if she does have episodes, we could consider balloon angioplasty of the ostium and proximal portion of the small diagonal branch. This is probably at most a 2 mm vessel.  EKG: Sinus rhythm, no ST elevation  FOLLOW UP PLANS AND APPOINTMENTS Allergies  Allergen Reactions  . Contrast Media [Iodinated Diagnostic Agents] Itching and Swelling  . Sulfa Drugs Cross Reactors Swelling  . Penicillins Rash     Medication List         aspirin 81 MG EC tablet  Take 1 tablet (81 mg total) by mouth daily.     atorvastatin 80 MG tablet  Commonly known as:  LIPITOR  Take 1 tablet (80 mg total) by mouth daily at 6 PM.     diphenhydrAMINE 25 MG tablet  Commonly known as:  SOMINEX  Take 25 mg by mouth at bedtime as needed for sleep.     isosorbide mononitrate 30 MG 24 hr tablet  Commonly known as:  IMDUR  Take 1 tablet (30 mg total) by mouth daily.     losartan-hydrochlorothiazide 100-12.5 MG per tablet  Commonly known as:  HYZAAR  Take 1 tablet by mouth daily.     metFORMIN 500 MG 24 hr tablet  Commonly known as:  GLUCOPHAGE XR  Take 4 tablets (2,000 mg total) by mouth daily with breakfast. HOLD for 48 hours, restart on 10/27/2014.     metoprolol 100 MG tablet  Commonly known as:  LOPRESSOR  Take 1 tablet (100 mg total) by mouth 2 (two) times daily.     nitroGLYCERIN 0.4 MG SL tablet  Commonly known as:  NITROSTAT  Place 1 tablet (0.4 mg total) under the tongue every 5  (five) minutes x 3 doses as needed for chest pain.     prasugrel 10 MG Tabs tablet  Commonly known as:  EFFIENT  Take 1 tablet (10 mg total) by mouth daily.     TRULICITY 0.75 MG/0.5ML Sopn  Generic drug:  Dulaglutide  Inject 0.75 mg into the skin every 7 (seven) days. On Sunday     TYLENOL PM EXTRA STRENGTH PO  Take 1 tablet by mouth daily as needed (for pain).     VITAMIN D PO  Take 1 tablet by mouth daily.        Discharge Instructions   Diet - low sodium heart healthy    Complete by:  As directed      Increase activity slowly    Complete by:  As directed           Follow-up Information   Follow up with Nona DellSamuel McDowell, MD On 10/30/2014. (Keep 8:40  AM appointment)    Specialty:  Cardiology   Contact information:   [address] [place] [id number] [phone]       BRING ALL MEDICATIONS WITH YOU TO FOLLOW UP APPOINTMENTS  Time spent with patient to include physician time: 41 min Signed: Theodore Demarkhonda Geralyn Figiel, PA-C 10/25/2014, 11:48 AM Co-Sign MD

## 2014-10-25 NOTE — Discharge Summary (Signed)
Patient seen and examined and history reviewed. Agree with above findings and plan. See earlier rounding note.  Lucy Woolever, MDFACC 10/25/2014 2:24 PM    

## 2014-10-25 NOTE — Care Management Note (Signed)
10-25-14 1219 CM did speak with pt in regards to PCP and pt goes to [facility] and sees Dr. Dimas [place]Howard [place]. No needs from CM at this time. Gala LewandowskyGraves-Bigelow, Jens Siems Kaye, RN,BSN [phone]

## 2014-10-25 NOTE — Discharge Instructions (Signed)
PLEASE REMEMBER TO BRING ALL OF YOUR MEDICATIONS TO EACH OF YOUR FOLLOW-UP OFFICE VISITS. ° °PLEASE ATTEND ALL SCHEDULED FOLLOW-UP APPOINTMENTS.  ° °Activity: Increase activity slowly as tolerated. You may shower, but no soaking baths (or swimming) for 1 week. No driving for 2 days. No lifting over 5 lbs for 1 week. No sexual activity for 1 week.  ° °You May Return to Work: in 1 week (if applicable) ° °Wound Care: You may wash cath site gently with soap and water. Keep cath site clean and dry. If you notice pain, swelling, bleeding or pus at your cath site, please call [phone]. ° ° ° °Cardiac Cath Site Care °Refer to this sheet in the next few weeks. These instructions provide you with information on caring for yourself after your procedure. Your caregiver may also give you more specific instructions. Your treatment has been planned according to current medical practices, but problems sometimes occur. Call your caregiver if you have any problems or questions after your procedure. °HOME CARE INSTRUCTIONS °· You may shower 24 hours after the procedure. Remove the bandage (dressing) and gently wash the site with plain soap and water. Gently pat the site dry.  °· Do not apply powder or lotion to the site.  °· Do not sit in a bathtub, swimming pool, or whirlpool for 5 to 7 days.  °· No bending, squatting, or lifting anything over 10 pounds (4.5 kg) as directed by your caregiver.  °· Inspect the site at least twice daily.  °· Do not drive home if you are discharged the same day of the procedure. Have someone else drive you.  °· You may drive 24 hours after the procedure unless otherwise instructed by your caregiver.  °What to expect: °· Any bruising will usually fade within 1 to 2 weeks.  °· Blood that collects in the tissue (hematoma) may be painful to the touch. It should usually decrease in size and tenderness within 1 to 2 weeks.  °SEEK IMMEDIATE MEDICAL CARE IF: °· You have unusual pain at the site or down the  affected limb.  °· You have redness, warmth, swelling, or pain at the site.  °· You have drainage (other than a small amount of blood on the dressing).  °· You have chills.  °· You have a fever or persistent symptoms for more than 72 hours.  °· You have a fever and your symptoms suddenly get worse.  °· Your leg becomes pale, cool, tingly, or numb.  °· You have heavy bleeding from the site. Hold pressure on the site.  °Document Released: 01/15/2011 Document Revised: 12/02/2011 Document Reviewed: 01/15/2011 °ExitCare® Patient Information ©2012 [employer]. ° °

## 2014-10-30 ENCOUNTER — Ambulatory Visit (INDEPENDENT_AMBULATORY_CARE_PROVIDER_SITE_OTHER): Payer: 59 | Admitting: Cardiology

## 2014-10-30 ENCOUNTER — Other Ambulatory Visit: Payer: Self-pay | Admitting: *Deleted

## 2014-10-30 ENCOUNTER — Encounter: Payer: Self-pay | Admitting: Cardiology

## 2014-10-30 VITALS — BP 126/84 | HR 65 | Ht 64.0 in | Wt 257.0 lb

## 2014-10-30 DIAGNOSIS — E782 Mixed hyperlipidemia: Secondary | ICD-10-CM

## 2014-10-30 DIAGNOSIS — I25119 Atherosclerotic heart disease of native coronary artery with unspecified angina pectoris: Secondary | ICD-10-CM

## 2014-10-30 DIAGNOSIS — Z5181 Encounter for therapeutic drug level monitoring: Secondary | ICD-10-CM

## 2014-10-30 DIAGNOSIS — E1159 Type 2 diabetes mellitus with other circulatory complications: Secondary | ICD-10-CM

## 2014-10-30 DIAGNOSIS — I214 Non-ST elevation (NSTEMI) myocardial infarction: Secondary | ICD-10-CM

## 2014-10-30 DIAGNOSIS — I222 Subsequent non-ST elevation (NSTEMI) myocardial infarction: Secondary | ICD-10-CM

## 2014-10-30 DIAGNOSIS — I10 Essential (primary) hypertension: Secondary | ICD-10-CM

## 2014-10-30 NOTE — Assessment & Plan Note (Signed)
Status post DES to the mid LAD in October, otherwise no significant obstructive CAD in the circumflex or RCA.

## 2014-10-30 NOTE — Assessment & Plan Note (Signed)
Keep followup with Dr. Howard. 

## 2014-10-30 NOTE — Assessment & Plan Note (Signed)
She is on high-dose Lipitor at this time, baseline LDL was 61. Check FLP and LFTs for next visit.

## 2014-10-30 NOTE — Assessment & Plan Note (Signed)
Blood pressure control is adequate today.

## 2014-10-30 NOTE — Progress Notes (Signed)
Reason for visit: Post hospital visit, CAD, recent NSTEMI  Clinical Summary Andrea Hughes is a 58 y.o.female presenting for a transition of care post hospital visit. This is our first meeting. I reviewed her records including [facility] [place] in late [place] with a NSTEMI (peak troponin I was 2.0). She was managed with DES to the mid LAD on 10/26 by Dr. Herbie BaltimoreHarding, discharged on 10/27, and then readmitted to the hospital on 10/28 with recurrent chest pain. She underwent a repeat heart catheterization on 10/29 demonstrating patent LAD stent site with jailed D3 associated with moderate ostial and proximal stenosis of the D3. It was felt that her recurrent symptoms and enzyme elevation was related to coronary spasm and plaque shift within the diagonal branch. No further intervention was required.of note, she had no other significant obstructive CAD within the circumflex or RCA. LVEF was estimated at 50-55% with possible anterior hypokinesis based on her initial left ventriculogram which is described as being overall poor imaging. Follow-up echocardiogram anticipated, but not yet completed.  Record review finds prior heart catheterization with Dr. Gerri SporePulsipher back in June 2005 at which time the patient had no significant CAD.  Lab work done recently showed cholesterol 132, triglycerides 124, HDL 46, LDL 61  She is here today with her husband. She denies any significant angina symptoms or unusual shortness of breath. Has some soreness in her right arm status post catheterization. Mild residual, resolving ecchymoses noted. She has not yet returned to work. 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 Anticipate that she would be out somewhere between 2-4 weeks.  Today we reviewed her medications, discussed the importance of DAPT, also discussed scheduling a follow-up echocardiogram to assess LVEF. She reports  compliance with her medications, tolerating high-dose Lipitor for now.   Allergies  Allergen Reactions  . Contrast Media [Iodinated Diagnostic Agents] Itching and Swelling  . Sulfa Drugs Cross Reactors Swelling  . Penicillins Rash    Current Outpatient Prescriptions  Medication Sig Dispense Refill  . acetaminophen (TYLENOL) 500 MG tablet Take 500 mg by mouth every 6 (six) hours as needed.    Marland Kitchen. aspirin EC 81 MG EC tablet Take 1 tablet (81 mg total) by mouth daily.    Marland Kitchen. atorvastatin (LIPITOR) 80 MG tablet Take 1 tablet (80 mg total) by mouth daily at 6 PM. 30 tablet 11  . Cholecalciferol (VITAMIN D PO) Take 1 tablet by mouth daily.    . diphenhydrAMINE (SOMINEX) 25 MG tablet Take 25 mg by mouth at bedtime as needed for sleep.     . Diphenhydramine-APAP, sleep, (TYLENOL PM EXTRA STRENGTH PO) Take 1 tablet by mouth daily as needed (for pain).     . isosorbide mononitrate (IMDUR) 30 MG 24 hr tablet Take 1 tablet (30 mg total) by mouth daily. 30 tablet 11  . losartan-hydrochlorothiazide (HYZAAR) 100-12.5 MG per tablet Take 1 tablet by mouth daily.     . metFORMIN (GLUCOPHAGE XR) 500 MG 24 hr tablet Take 4 tablets (2,000 mg total) by mouth daily with breakfast. HOLD for 48 hours, restart on 10/27/2014. 120 tablet 11  . metoprolol (LOPRESSOR) 100 MG tablet Take 1 tablet (100 mg total) by mouth 2 (two) times daily. 180 tablet 3  . nitroGLYCERIN (NITROSTAT) 0.4 MG SL tablet Place 1 tablet (0.4 mg total) under the tongue every 5 (five) minutes x 3 doses as needed for chest pain. 25 tablet  12  . prasugrel (EFFIENT) 10 MG TABS tablet Take 1 tablet (10 mg total) by mouth daily. 30 tablet 11  . TRULICITY 0.75 MG/0.5ML SOPN Inject 0.75 mg into the skin every 7 (seven) days. On Sunday     No current facility-administered medications for this visit.    Past Medical History  Diagnosis Date  . Essential hypertension   . Type 2 diabetes mellitus   . Depression   . Hyperlipidemia   . Morbid obesity   .  CAD (coronary artery disease)     a. 10/18/2014 NSTEMI, DES to mLAD 10/21/2014 b. 10/23/2014 NSTEMI, cath with patent stent, moderate stenosis in D3 enzyme elevation felt secondary to spasm with mild plaque shift in the diagonal  . NSTEMI (non-ST elevated myocardial infarction) October 2015    Past Surgical History  Procedure Laterality Date  . Tubal ligation    . Back surgery  1988  . Cholecystectomy  2004  . Breast surgery      1984  . Tonsillectomy      58  years old  . Colonoscopy  09/07/2011    Procedure: COLONOSCOPY;  Surgeon: Dalia HeadingMark A Jenkins;  Location: [facility];  Service: Gastroenterology;  Laterality: N/A;  . Vaginal hysterectomy    . Bladder suspension      Family History  Problem Relation Age of Onset  . Cancer Father     Lung  . Heart disease Mother     Valve replacement     Social History Ms. Isbell reports that she has never smoked. She has never used smokeless tobacco. Andrea Hughes reports that she does not drink alcohol.  Review of Systems Complete review of systems negative except as otherwise outlined in the clinical summary and also the following. No palpitations, no orthopnea or PND, no claudication.  Physical Examination Filed Vitals:   10/30/14 0837  BP: 126/84  Pulse: 65   Filed Weights   10/30/14 0837  Weight: 257 lb (116.574 kg)   Obese woman, appears comfortable at rest. HEENT: Conjunctiva and lids normal, oropharynx clear. Neck: Supple, no elevated JVP or carotid bruits, no thyromegaly. Lungs: Clear to auscultation, nonlabored breathing at rest. Cardiac: Regular rate and rhythm, no S3 or significant systolic murmur, no pericardial rub. Abdomen: Soft, nontender, bowel sounds present. Extremities: Right arm with mild resolving ecchymoses in the antecubital region. Radial pulses full. No leg pitting edema, distal pulses 2+. Skin: Warm and dry. Musculoskeletal: No kyphosis. Neuropsychiatric: Alert and oriented x3, affect grossly  appropriate.   Problem List and Plan   Non-ST elevation myocardial infarction (NSTEMI), subsequent episode of care No recurrent chest pain symptoms, peak troponin was 2.0. Suboptimal LV gram at initial heart catheterization estimated LVEF 50-55% with anterior hypokinesis. We will obtain a follow-up echocardiogram for better evaluation. Continue medical therapy including DAPT. She declines cardiac rehabilitation. I encouraged a regular walking regimen with gradually increasing time. I will see her back in 6 weeks.  CAD (coronary artery disease), native coronary artery Status post DES to the mid LAD in October, otherwise no significant obstructive CAD in the circumflex or RCA.  Mixed hyperlipidemia She is on high-dose Lipitor at this time, baseline LDL was 61. Check FLP and LFTs for next visit.  Type 2 diabetes mellitus with circulatory disorder Keep follow-up with Dr. Dimas AguasHoward.  Essential hypertension Blood pressure control is adequate today.    Jonelle SidleSamuel G. McDowell, M.D., F.A.C.C.

## 2014-10-30 NOTE — Patient Instructions (Signed)
Your physician recommends that you schedule a follow-up appointment in: 6 weeks. Your physician recommends that you continue on your current medications as directed. Please refer to the Current Medication list given to you today. Your physician has requested that you have an echocardiogram. Echocardiography is a painless test that uses sound waves to create images of your heart. It provides your doctor with information about the size and shape of your heart and how well your heart's chambers and valves are working. This procedure takes approximately one hour. There are no restrictions for this procedure. Your physician recommends that you have a FASTING lipid/liver profile in about 6 weeks just before your next visit. Please make sure you are fasting at least eight hours.

## 2014-10-30 NOTE — Assessment & Plan Note (Signed)
No recurrent chest pain symptoms, peak troponin was 2.0. Suboptimal LV gram at initial heart catheterization estimated LVEF 50-55% with anterior hypokinesis. We will obtain a follow-up echocardiogram for better evaluation. Continue medical therapy including DAPT. She declines cardiac rehabilitation. I encouraged a regular walking regimen with gradually increasing time. I will see her back in 6 weeks.

## 2014-10-31 ENCOUNTER — Other Ambulatory Visit: Payer: Self-pay

## 2014-10-31 ENCOUNTER — Other Ambulatory Visit (INDEPENDENT_AMBULATORY_CARE_PROVIDER_SITE_OTHER): Payer: 59

## 2014-10-31 ENCOUNTER — Telehealth: Payer: Self-pay | Admitting: *Deleted

## 2014-10-31 DIAGNOSIS — I222 Subsequent non-ST elevation (NSTEMI) myocardial infarction: Secondary | ICD-10-CM

## 2014-10-31 DIAGNOSIS — I214 Non-ST elevation (NSTEMI) myocardial infarction: Secondary | ICD-10-CM

## 2014-10-31 DIAGNOSIS — I25119 Atherosclerotic heart disease of native coronary artery with unspecified angina pectoris: Secondary | ICD-10-CM

## 2014-10-31 NOTE — Telephone Encounter (Signed)
Patient informed. 

## 2014-10-31 NOTE — Telephone Encounter (Signed)
-----   Message from Jonelle SidleSamuel G McDowell, MD sent at 10/31/2014 11:03 AM EST ----- Reviewed. Please let her know that LV systolic function is in normal range at this time, 55-60%. We will continue medical therapy.

## 2014-11-15 ENCOUNTER — Telehealth: Payer: Self-pay | Admitting: *Deleted

## 2014-11-15 ENCOUNTER — Encounter: Payer: Self-pay | Admitting: *Deleted

## 2014-11-15 NOTE — Telephone Encounter (Signed)
Patient informed. 

## 2014-11-15 NOTE — Telephone Encounter (Addendum)
Patient is requesting a note for work dated to return on 11/25/14. Please advise.

## 2014-11-15 NOTE — Telephone Encounter (Signed)
Not exactly sure that I follow the question. We talked about being out of work anywhere between 2-4 weeks from hospital discharge.

## 2014-12-03 ENCOUNTER — Telehealth: Payer: Self-pay | Admitting: *Deleted

## 2014-12-03 ENCOUNTER — Other Ambulatory Visit: Payer: Self-pay | Admitting: Cardiology

## 2014-12-03 LAB — LIPID PANEL
CHOLESTEROL: 117 mg/dL (ref 0–200)
HDL: 36 mg/dL — ABNORMAL LOW (ref >39)
LDL Cholesterol: 66 mg/dL (ref 0–99)
TRIGLYCERIDES: 76 mg/dL (ref <150)
Total CHOL/HDL Ratio: 3.3 Ratio
VLDL: 15 mg/dL (ref 0–40)

## 2014-12-03 LAB — HEPATIC FUNCTION PANEL
ALT: 22 U/L (ref 0–35)
AST: 23 U/L (ref 0–37)
Albumin: 4.1 g/dL (ref 3.5–5.2)
Alkaline Phosphatase: 103 U/L (ref 39–117)
BILIRUBIN DIRECT: 0.2 mg/dL (ref 0.0–0.3)
BILIRUBIN INDIRECT: 0.5 mg/dL (ref 0.2–1.2)
TOTAL PROTEIN: 6 g/dL (ref 6.0–8.3)
Total Bilirubin: 0.7 mg/dL (ref 0.2–1.2)

## 2014-12-03 NOTE — Telephone Encounter (Signed)
-----   Message from Jonelle SidleSamuel G McDowell, MD sent at 12/03/2014  4:21 PM EST ----- Reviewed. LDL 66. Continue current medications.

## 2014-12-03 NOTE — Telephone Encounter (Signed)
Forwarded to Dr. Dimas AguasHoward, pt sister in law confirmed appointment for Friday

## 2014-12-05 ENCOUNTER — Encounter (HOSPITAL_COMMUNITY): Payer: Self-pay | Admitting: Cardiology

## 2014-12-06 ENCOUNTER — Ambulatory Visit: Payer: 59 | Admitting: Cardiology

## 2014-12-06 ENCOUNTER — Ambulatory Visit (INDEPENDENT_AMBULATORY_CARE_PROVIDER_SITE_OTHER): Payer: 59 | Admitting: Cardiology

## 2014-12-06 ENCOUNTER — Encounter: Payer: Self-pay | Admitting: Cardiology

## 2014-12-06 VITALS — BP 123/82 | HR 64 | Ht 64.0 in | Wt 257.0 lb

## 2014-12-06 DIAGNOSIS — I10 Essential (primary) hypertension: Secondary | ICD-10-CM

## 2014-12-06 DIAGNOSIS — I2510 Atherosclerotic heart disease of native coronary artery without angina pectoris: Secondary | ICD-10-CM

## 2014-12-06 DIAGNOSIS — E782 Mixed hyperlipidemia: Secondary | ICD-10-CM

## 2014-12-06 NOTE — Patient Instructions (Signed)
Your physician recommends that you schedule a follow-up appointment in: 3 months. Your physician recommends that you continue on your current medications as directed. Please refer to the Current Medication list given to you today. 

## 2014-12-06 NOTE — Assessment & Plan Note (Signed)
Doing well, symptomatically stable on medical therapy. She has returned to work full time. At this point no changes in treatment, I will see her back in 3 months.

## 2014-12-06 NOTE — Progress Notes (Signed)
Reason for visit: CAD  Clinical Summary Andrea Hughes is a 58 y.o.female seen for the first time in hospital follow-up back in November. History is detailed in the previous note. She went back to work at full capacity on November 28. States that she has been very busy, working on second shift. She works at [employer]. No angina symptoms however, NYHA class II dyspnea. She reports compliance with her medications including DAPT.  Follow-up echocardiogram done in November showed mild LVH with LVEF 55-60%, mild hypokinesis of the apical anteroseptal wall, grade 1 diastolic dysfunction, mildly sclerotic aortic valve, trivial tricuspid regurgitation, unable to assess PASP. We discussed these results.  Recent lab work shows cholesterol 117, triglycerides 76, HDL 36, LDL 66, normal LFTs. He continues on Lipitor, no obvious side effects.  Allergies  Allergen Reactions  . Contrast Media [Iodinated Diagnostic Agents] Itching and Swelling  . Sulfa Drugs Cross Reactors Swelling  . Penicillins Rash    Current Outpatient Prescriptions  Medication Sig Dispense Refill  . acetaminophen (TYLENOL) 500 MG tablet Take 500 mg by mouth every 6 (six) hours as needed.    Marland Kitchen. aspirin EC 81 MG EC tablet Take 1 tablet (81 mg total) by mouth daily.    Marland Kitchen. atorvastatin (LIPITOR) 80 MG tablet Take 1 tablet (80 mg total) by mouth daily at 6 PM. 30 tablet 11  . Cholecalciferol (VITAMIN D PO) Take 1 tablet by mouth daily.    . diphenhydrAMINE (SOMINEX) 25 MG tablet Take 25 mg by mouth at bedtime as needed for sleep.     . Diphenhydramine-APAP, sleep, (TYLENOL PM EXTRA STRENGTH PO) Take 1 tablet by mouth daily as needed (for pain).     . isosorbide mononitrate (IMDUR) 30 MG 24 hr tablet Take 1 tablet (30 mg total) by mouth daily. 30 tablet 11  . losartan-hydrochlorothiazide (HYZAAR) 100-12.5 MG per tablet Take 1 tablet by mouth daily.     . metFORMIN (GLUCOPHAGE XR) 500 MG 24 hr tablet Take 4 tablets  (2,000 mg total) by mouth daily with breakfast. HOLD for 48 hours, restart on 10/27/2014. 120 tablet 11  . metoprolol (LOPRESSOR) 100 MG tablet Take 1 tablet (100 mg total) by mouth 2 (two) times daily. 180 tablet 3  . nitroGLYCERIN (NITROSTAT) 0.4 MG SL tablet Place 1 tablet (0.4 mg total) under the tongue every 5 (five) minutes x 3 doses as needed for chest pain. 25 tablet 12  . prasugrel (EFFIENT) 10 MG TABS tablet Take 1 tablet (10 mg total) by mouth daily. 30 tablet 11  . TRULICITY 0.75 MG/0.5ML SOPN Inject 0.75 mg into the skin every 7 (seven) days. On Sunday     No current facility-administered medications for this visit.    Past Medical History  Diagnosis Date  . Essential hypertension   . Type 2 diabetes mellitus   . Depression   . Hyperlipidemia   . Morbid obesity   . CAD (coronary artery disease)     a. 10/18/2014 NSTEMI, DES to mLAD 10/21/2014 b. 10/23/2014 NSTEMI, cath with patent stent, moderate stenosis in D3 enzyme elevation felt secondary to spasm with mild plaque shift in the diagonal  . NSTEMI (non-ST elevated myocardial infarction) October 2015    Past Surgical History  Procedure Laterality Date  . Tubal ligation    . Back surgery  1988  . Cholecystectomy  2004  . Breast surgery      1984  . Tonsillectomy      58  years old  .  Colonoscopy  09/07/2011    Procedure: COLONOSCOPY;  Surgeon: Dalia HeadingMark A Jenkins;  [facility];  Service: Gastroenterology;  Laterality: N/A;  . Vaginal hysterectomy    . Bladder suspension    . Left heart catheterization with coronary angiogram N/A 10/21/2014    Procedure: LEFT HEART CATHETERIZATION WITH CORONARY ANGIOGRAM;  Surgeon: Marykay Lexavid W Harding, MD;  [facility];  Service: Cardiovascular;  Laterality: N/A;  . Left heart catheterization with coronary angiogram N/A 10/24/2014    Procedure: LEFT HEART CATHETERIZATION WITH CORONARY ANGIOGRAM;  Surgeon: Marykay Lexavid W Harding, MD;  [facility];  Service:  Cardiovascular;  Laterality: N/A;    Family History  Problem Relation Age of Onset  . Cancer Father     Lung  . Heart disease Mother     Valve replacement     Social History Ms. Lizotte reports that she has never smoked. She has never used smokeless tobacco. Andrea Hughes reports that she does not drink alcohol.  Review of Systems Complete review of systems negative except as otherwise outlined in the clinical summary and also the following. No palpitations, no syncope, no bleeding episodes.  Physical Examination Filed Vitals:   12/06/14 0850  BP: 123/82  Pulse: 64   Filed Weights   12/06/14 0850  Weight: 257 lb (116.574 kg)   Obese woman, appears comfortable at rest. HEENT: Conjunctiva and lids normal, oropharynx clear. Neck: Supple, no elevated JVP or carotid bruits, no thyromegaly. Lungs: Clear to auscultation, nonlabored breathing at rest. Cardiac: Regular rate and rhythm, no S3 or significant systolic murmur, no pericardial rub. Abdomen: Soft, nontender, bowel sounds present. Extremities: No leg pitting edema, distal pulses 2+.   Problem List and Plan   CAD (coronary artery disease), native coronary artery Doing well, symptomatically stable on medical therapy. She has returned to work full time. At this point no changes in treatment, I will see her back in 3 months.  Essential hypertension Blood pressure is well controlled today. No changes made.  Mixed hyperlipidemia She continues on high-dose Lipitor, recent LDL well controlled.    Andrea SidleSamuel G. Verlyn Dannenberg, M.D., F.A.C.C.

## 2014-12-06 NOTE — Assessment & Plan Note (Signed)
Blood pressure is well controlled today. No changes made. 

## 2014-12-06 NOTE — Assessment & Plan Note (Signed)
She continues on high-dose Lipitor, recent LDL well controlled.

## 2015-02-19 ENCOUNTER — Telehealth: Payer: Self-pay | Admitting: *Deleted

## 2015-02-19 NOTE — Telephone Encounter (Signed)
Spoke with patient and she is concerned about the funny feeling in her chest and her BP 131/93 &  HR 65, no dizziness, sob. Said her chest felt a little funny. No pain but rated the feeling a #3. Patient has not used nitroglycerin since feeling wasn't severe chest pain. Patient given appointment to be seen tomorrow.

## 2015-02-20 ENCOUNTER — Encounter: Payer: Self-pay | Admitting: Cardiology

## 2015-02-20 ENCOUNTER — Ambulatory Visit (INDEPENDENT_AMBULATORY_CARE_PROVIDER_SITE_OTHER): Payer: 59 | Admitting: Cardiology

## 2015-02-20 VITALS — BP 138/66 | HR 63 | Ht 65.0 in | Wt 250.0 lb

## 2015-02-20 DIAGNOSIS — F419 Anxiety disorder, unspecified: Secondary | ICD-10-CM

## 2015-02-20 DIAGNOSIS — F32A Depression, unspecified: Secondary | ICD-10-CM

## 2015-02-20 DIAGNOSIS — I25119 Atherosclerotic heart disease of native coronary artery with unspecified angina pectoris: Secondary | ICD-10-CM

## 2015-02-20 DIAGNOSIS — E782 Mixed hyperlipidemia: Secondary | ICD-10-CM

## 2015-02-20 DIAGNOSIS — F418 Other specified anxiety disorders: Secondary | ICD-10-CM

## 2015-02-20 DIAGNOSIS — F329 Major depressive disorder, single episode, unspecified: Secondary | ICD-10-CM

## 2015-02-20 DIAGNOSIS — I10 Essential (primary) hypertension: Secondary | ICD-10-CM

## 2015-02-20 MED ORDER — ISOSORBIDE MONONITRATE ER 60 MG PO TB24: 60.0000 mg | ORAL_TABLET | Freq: Every day | ORAL | Status: DC

## 2015-02-20 NOTE — Patient Instructions (Signed)
Your physician recommends that you schedule a follow-up appointment in: 3 months. Your physician has recommended you make the following change in your medication:  Increase your isosorbide mononitrate to 60 mg daily. You may take (2) of your 30 mg daily until they are finished. Continue all other medications the same.

## 2015-02-20 NOTE — Progress Notes (Signed)
Cardiology Office Note  Date: 02/20/2015   ID: Andrea CurlRobin W Luster, [place] MeoDOB Apr 12, 1956, MRN [id number]  PCP: Selinda FlavinHOWARD, KEVIN, MD  Primary Cardiologist: Nona DellSamuel McDowell, MD   Chief Complaint  Patient presents with  . Coronary Artery Disease  . Chest Pain    History of Present Illness: Andrea Hughes is a 59 y.o. female last seen in December 2015. She presents today describing recent symptoms including intermittent chest discomfort not entirely similar to her previous angina, also anxiety, crying spells, stress in her life. She states that following her heart attack late last year, her mother had a stroke and also diagnosis of cancer. She is now working second shift which is also been stressful.  She reports compliance with her cardiac medications, no increasing nitroglycerin use. Follow-up tracing today is noted below and stable.  She does have a history of anxiety and depression, currently not on any specific medications, has not seen Dr. Dimas AguasHoward recently.   Past Medical History  Diagnosis Date  . Essential hypertension   . Type 2 diabetes mellitus   . Depression   . Hyperlipidemia   . Morbid obesity   . CAD (coronary artery disease)     a. 10/18/2014 NSTEMI, DES to mLAD 10/21/2014 b. 10/23/2014 NSTEMI, cath with patent stent, moderate stenosis in D3 enzyme elevation felt secondary to spasm with mild plaque shift in the diagonal  . NSTEMI (non-ST elevated myocardial infarction) October 2015    Past Surgical History  Procedure Laterality Date  . Tubal ligation    . Back surgery  1988  . Cholecystectomy  2004  . Breast surgery      1984  . Tonsillectomy      59 years old  . Colonoscopy  09/07/2011    Procedure: COLONOSCOPY;  Surgeon: Dalia HeadingMark A Jenkins;  [facility];  Service: Gastroenterology;  Laterality: N/A;  . Vaginal hysterectomy    . Bladder suspension    . Left heart catheterization with coronary angiogram N/A 10/21/2014    Procedure: LEFT HEART  CATHETERIZATION WITH CORONARY ANGIOGRAM;  Surgeon: Marykay Lexavid W Harding, MD;  [facility];  Service: Cardiovascular;  Laterality: N/A;  . Left heart catheterization with coronary angiogram N/A 10/24/2014    Procedure: LEFT HEART CATHETERIZATION WITH CORONARY ANGIOGRAM;  Surgeon: Marykay Lexavid W Harding, MD;  [facility];  Service: Cardiovascular;  Laterality: N/A;    Current Outpatient Prescriptions  Medication Sig Dispense Refill  . acetaminophen (TYLENOL) 500 MG tablet Take 500 mg by mouth every 6 (six) hours as needed.    Marland Kitchen. aspirin EC 81 MG EC tablet Take 1 tablet (81 mg total) by mouth daily.    Marland Kitchen. atorvastatin (LIPITOR) 80 MG tablet Take 1 tablet (80 mg total) by mouth daily at 6 PM. 30 tablet 11  . Cholecalciferol (VITAMIN D PO) Take 1 tablet by mouth daily.    . diphenhydrAMINE (SOMINEX) 25 MG tablet Take 25 mg by mouth at bedtime as needed for sleep.     . Diphenhydramine-APAP, sleep, (TYLENOL PM EXTRA STRENGTH PO) Take 1 tablet by mouth daily as needed (for pain).     Marland Kitchen. losartan-hydrochlorothiazide (HYZAAR) 100-12.5 MG per tablet Take 1 tablet by mouth daily.     . metFORMIN (GLUCOPHAGE XR) 500 MG 24 hr tablet Take 4 tablets (2,000 mg total) by mouth daily with breakfast. HOLD for 48 hours, restart on 10/27/2014. 120 tablet 11  . metoprolol (LOPRESSOR) 100 MG tablet Take 1 tablet (100 mg total) by mouth  2 (two) times daily. 180 tablet 3  . nitroGLYCERIN (NITROSTAT) 0.4 MG SL tablet Place 1 tablet (0.4 mg total) under the tongue every 5 (five) minutes x 3 doses as needed for chest pain. 25 tablet 12  . prasugrel (EFFIENT) 10 MG TABS tablet Take 1 tablet (10 mg total) by mouth daily. 30 tablet 11  . TRULICITY 0.75 MG/0.5ML SOPN Inject 0.75 mg into the skin every 7 (seven) days. On Sunday     No current facility-administered medications for this visit.    Allergies:  Contrast media; Sulfa drugs cross reactors; and Penicillins   Social History: The patient  reports that she has  never smoked. She has never used smokeless tobacco. She reports that she does not drink alcohol or use illicit drugs.   ROS:  Please see the history of present illness. Otherwise, complete review of systems is positive for none.  All other systems are reviewed and negative.    Physical Exam: VS:  BP 138/66 mmHg  Pulse 63  Ht  (1.651 m)  Wt 250 lb (113.399 kg)  BMI 41.60 kg/m2  SpO2 98%, BMI Body mass index is 41.6 kg/(m^2).  Wt Readings from Last 3 Encounters:  02/20/15 250 lb (113.399 kg)  12/06/14 257 lb (116.574 kg)  10/30/14 257 lb (116.574 kg)     Obese woman, appears comfortable at rest. HEENT: Conjunctiva and lids normal, oropharynx clear. Neck: Supple, no elevated JVP or carotid bruits, no thyromegaly. Lungs: Clear to auscultation, nonlabored breathing at rest. Cardiac: Regular rate and rhythm, no S3 or significant systolic murmur, no pericardial rub. Abdomen: Soft, nontender, bowel sounds present. Extremities: No leg pitting edema, distal pulses 2+. Skin: Warm and dry. Musculoskeletal: No kyphosis. Neuropsychiatric: Alert and oriented 3, affect appropriate, tearful describing her symptoms.   ECG: ECG is ordered today and reviewed showing sinus rhythm with left axis deviation and decreased anterior R wave progression.   Recent Labwork: 10/18/2014: TSH 1.970 10/24/2014: BUN 12; Creatinine 0.57; Potassium 3.5*; Sodium 140 10/25/2014: Hemoglobin 12.9; Platelets 182 12/03/2014: ALT 22; AST 23     Component Value Date/Time   CHOL 117 12/03/2014 0911   TRIG 76 12/03/2014 0911   HDL 36* 12/03/2014 0911   CHOLHDL 3.3 12/03/2014 0911   VLDL 15 12/03/2014 0911   LDLCALC 66 12/03/2014 0911    Other Studies Reviewed Today:  Echocardiogram done in November 2015 showed mild LVH with LVEF 55-60%, mild hypokinesis of the apical anteroseptal wall, grade 1 diastolic dysfunction, mildly sclerotic aortic valve, trivial tricuspid regurgitation, unable to assess  PASP.  Assessment and Plan:  1. CAD status post DES to the mid LAD in October 2015, residual disease in the third diagonal branch that is being managed medically. She is reporting some symptoms that could be anginal, although also complicated by anxiety and depression. ECG is stable. Plan will be to increase Imdur to 30 mg twice daily and continue observation for now. She continues on DAPT.  2. Anxiety and depression, increasing symptoms based on discussion above. I have asked her to follow-up with Dr. Dimas Aguas as she may need to consider medical therapy.  3. Essential hypertension, no changes made to current regimen.  4. Hyperlipidemia, on statin therapy.  Current medicines are reviewed at length with the patient today.  The patient does not have concerns regarding medicines.    Orders Placed This Encounter  Procedures  . EKG 12-Lead    Disposition: FU with me in 3 months.   Signed, Jonelle Sidle,  MD, [facility] 02/20/2015 2:32 PM    [facility] at [facility] [address] [place], [place], [place] [address] Phone: [phone]; Fax: [phone]

## 2015-03-11 ENCOUNTER — Ambulatory Visit: Payer: 59 | Admitting: Cardiology

## 2015-05-28 ENCOUNTER — Ambulatory Visit (INDEPENDENT_AMBULATORY_CARE_PROVIDER_SITE_OTHER): Payer: 59 | Admitting: Cardiology

## 2015-05-28 ENCOUNTER — Encounter: Payer: Self-pay | Admitting: Cardiology

## 2015-05-28 VITALS — BP 124/84 | HR 58 | Ht 64.0 in | Wt 246.0 lb

## 2015-05-28 DIAGNOSIS — I2510 Atherosclerotic heart disease of native coronary artery without angina pectoris: Secondary | ICD-10-CM

## 2015-05-28 DIAGNOSIS — E782 Mixed hyperlipidemia: Secondary | ICD-10-CM | POA: Diagnosis not present

## 2015-05-28 DIAGNOSIS — I10 Essential (primary) hypertension: Secondary | ICD-10-CM | POA: Diagnosis not present

## 2015-05-28 MED ORDER — PRASUGREL HCL 10 MG PO TABS: 10.0000 mg | ORAL_TABLET | Freq: Every day | ORAL | Status: AC

## 2015-05-28 MED ORDER — PRASUGREL HCL 10 MG PO TABS: 10.0000 mg | ORAL_TABLET | Freq: Every day | ORAL | Status: DC

## 2015-05-28 NOTE — Patient Instructions (Addendum)
Your physician recommends that you continue on your current medications as directed. Please refer to the Current Medication list given to you today. Please take your effient through the end of October 2016. Continue aspirin 81 mg daily on October 28, 2015 after stopping effient. Continue all other medications the same. Your physician recommends that you schedule a follow-up appointment in: 6 months. You will receive a reminder letter in the mail in about 4 months reminding you to call and schedule your appointment. If you don't receive this letter, please contact our office.

## 2015-05-28 NOTE — Progress Notes (Signed)
Cardiology Office Note  Date: 05/28/2015   ID: Andrea Hughes, DOB 11-27-1956, MRN [id number]  PCP: Selinda Flavin, MD  Primary Cardiologist: Nona Dell, MD   Chief Complaint  Patient presents with  . Coronary Artery Disease  . Hyperlipidemia    History of Present Illness: Andrea Hughes is a 59 y.o. female last seen in February. She presents for a routine follow-up visit. At the last visit, we increased Imdur for control of angina symptoms. She states that she has been doing well without progressive angina or shortness of breath. Continues to work second shift, anxiety and depression have been better following medication adjustments with Dr. Dimas Aguas. She remains under stress however, her mother with dementia will be moving in with her soon.  Lab work from earlier in the year is reviewed below, LDL was well controlled.  She reports compliance with her medications, we will plan to stop Effient in November of this year as long she remains clinically stable from a cardiac perspective.   Past Medical History  Diagnosis Date  . Essential hypertension   . Type 2 diabetes mellitus   . Depression   . Hyperlipidemia   . Morbid obesity   . CAD (coronary artery disease)     a. 10/18/2014 NSTEMI, DES to mLAD 10/21/2014 b. 10/23/2014 NSTEMI, cath with patent stent, moderate stenosis in D3 enzyme elevation felt secondary to spasm with mild plaque shift in the diagonal  . NSTEMI (non-ST elevated myocardial infarction) October 2015     Current Outpatient Prescriptions  Medication Sig Dispense Refill  . acetaminophen (TYLENOL) 500 MG tablet Take 500 mg by mouth every 6 (six) hours as needed.    Marland Kitchen aspirin EC 81 MG EC tablet Take 1 tablet (81 mg total) by mouth daily.    Marland Kitchen atorvastatin (LIPITOR) 80 MG tablet Take 1 tablet (80 mg total) by mouth daily at 6 PM. 30 tablet 11  . Cholecalciferol (VITAMIN D PO) Take 1 tablet by mouth daily.    . clonazePAM (KLONOPIN) 1 MG tablet Take 1 mg  by mouth as needed for anxiety.    . Diphenhydramine-APAP, sleep, (TYLENOL PM EXTRA STRENGTH PO) Take 1 tablet by mouth daily as needed (for pain).     Marland Kitchen escitalopram (LEXAPRO) 10 MG tablet Take 10 mg by mouth daily.    . isosorbide mononitrate (IMDUR) 60 MG 24 hr tablet Take 1 tablet (60 mg total) by mouth daily. 90 tablet 3  . losartan-hydrochlorothiazide (HYZAAR) 100-12.5 MG per tablet Take 1 tablet by mouth daily.     . metFORMIN (GLUCOPHAGE XR) 500 MG 24 hr tablet Take 4 tablets (2,000 mg total) by mouth daily with breakfast. HOLD for 48 hours, restart on 10/27/2014. 120 tablet 11  . metoprolol (LOPRESSOR) 100 MG tablet Take 1 tablet (100 mg total) by mouth 2 (two) times daily. 180 tablet 3  . nitroGLYCERIN (NITROSTAT) 0.4 MG SL tablet Place 1 tablet (0.4 mg total) under the tongue every 5 (five) minutes x 3 doses as needed for chest pain. 25 tablet 12  . prasugrel (EFFIENT) 10 MG TABS tablet Take 1 tablet (10 mg total) by mouth daily. Stop effient at the end of October 2016, and continue aspirin 81 mg daily 30 tablet 0  . TRULICITY 0.75 MG/0.5ML SOPN Inject 0.75 mg into the skin every 7 (seven) days. On Sunday     No current facility-administered medications for this visit.    Allergies:  Contrast media; Sulfa drugs cross reactors; and Penicillins  Social History: The patient  reports that she has never smoked. She has never used smokeless tobacco. She reports that she does not drink alcohol or use illicit drugs.    ROS:  Please see the history of present illness. Otherwise, complete review of systems is positive for occasional shoulder and neck tightness.  All other systems are reviewed and negative.   Physical Exam: VS:  BP 124/84 mmHg  Pulse 58  Ht 5\' 4"  (1.626 m)  Wt 246 lb (111.585 kg)  BMI 42.21 kg/m2  SpO2 99%, BMI Body mass index is 42.21 kg/(m^2).  Wt Readings from Last 3 Encounters:  05/28/15 246 lb (111.585 kg)  02/20/15 250 lb (113.399 kg)  12/06/14 257 lb (116.574  kg)     Obese woman, appears comfortable at rest. HEENT: Conjunctiva and lids normal, oropharynx clear. Neck: Supple, no elevated JVP or carotid bruits, no thyromegaly. Lungs: Clear to auscultation, nonlabored breathing at rest. Cardiac: Regular rate and rhythm, no S3 or significant systolic murmur, no pericardial rub. Abdomen: Soft, nontender, bowel sounds present. Extremities: No leg pitting edema, distal pulses 2+. Skin: Warm and dry. Musculoskeletal: No kyphosis. Neuropsychiatric: Alert and oriented 3, affect appropriate, tearful describing her symptoms.   ECG: ECG is not ordered today.   Recent Labwork:  01/09/2015: BUN 16, creatinine 0.6, potassium 3.4, AST 11, ALT 20, cholesterol 129, triglycerides 82, HDL 47, LDL 66, hemoglobin A1c 6.0.  Other Studies Reviewed Today:  Echocardiogram 10/31/2014: Study Conclusions  - Left ventricle: The cavity size was normal. Wall thickness was increased in a pattern of mild LVH. Systolic function was normal. The estimated ejection fraction was in the range of 55% to 60%. Incidentally noted transverse false tendon near the LV apex. There is mild hypokinesis of the apicalanteroseptal myocardium. Doppler parameters are consistent with abnormal left ventricular relaxation (grade 1 diastolic dysfunction). - Aortic valve: Trileaflet. Nodular thickening of the noncoronary cusp. There was no significant regurgitation. - Mitral valve: Calcified annulus. There was trivial regurgitation. - Right atrium: Central venous pressure (est): 3 mm Hg. - Atrial septum: No defect or patent foramen ovale was identified. - Tricuspid valve: There was trivial regurgitation. - Pulmonary arteries: Systolic pressure could not be accurately estimated. - Pericardium, extracardiac: There was no pericardial effusion.  Impressions:  - Mild LVH with LVEF 55-60%, mild hypokinesis of the apical anteroseptal wall, grade 1 diastolic dysfunction. Mild  nodular thickening of the noncoronary aortic cusp. No aortic regurgitation. Trivial tricuspid regurgitation, unable to assess PASP.   Assessment and Plan:  1. Symptomatically stable CAD status post DES to the LAD in October 2015. Plan is to continue medical therapy and observation. As long she remains clinically stable, we will stop Effient in November of this year.  2. Essential hypertension, blood pressure is well controlled today.  3. Hyperlipidemia, on statin therapy, LDL 66 earlier in the year.  Current medicines were reviewed with the patient today.   Disposition: FU with me in 6 months.   Signed, Jonelle SidleSamuel G. Kristiane Morsch, MD, [facility] 05/28/2015 9:34 AM    [facility] [address] [place], [place], [place] [address] Phone: [phone]; Fax: [phone]) [phone]

## 2015-10-30 ENCOUNTER — Encounter: Payer: Self-pay | Admitting: *Deleted

## 2015-11-19 ENCOUNTER — Encounter: Payer: Self-pay | Admitting: Cardiology

## 2015-11-19 ENCOUNTER — Ambulatory Visit (INDEPENDENT_AMBULATORY_CARE_PROVIDER_SITE_OTHER): Payer: 59 | Admitting: Cardiology

## 2015-11-19 VITALS — BP 126/75 | HR 62 | Ht 64.0 in | Wt 277.4 lb

## 2015-11-19 DIAGNOSIS — E782 Mixed hyperlipidemia: Secondary | ICD-10-CM

## 2015-11-19 DIAGNOSIS — E1159 Type 2 diabetes mellitus with other circulatory complications: Secondary | ICD-10-CM

## 2015-11-19 DIAGNOSIS — I2510 Atherosclerotic heart disease of native coronary artery without angina pectoris: Secondary | ICD-10-CM | POA: Diagnosis not present

## 2015-11-19 DIAGNOSIS — I10 Essential (primary) hypertension: Secondary | ICD-10-CM | POA: Diagnosis not present

## 2015-11-19 NOTE — Patient Instructions (Signed)
Your physician has recommended you make the following change in your medication:  Stop effient. Continue all other medications the same. Your physician recommends that you schedule a follow-up appointment in: 6 months. You will receive a reminder letter in the mail in about 4 months reminding you to call and schedule your appointment. If you don't receive this letter, please contact our office.

## 2015-11-19 NOTE — Progress Notes (Signed)
.    Cardiology Office Note  Date: 11/19/2015   ID: Andrea CurlRobin W Crouse, DOB [birth year], MRN [id number]  PCP: Selinda FlavinHOWARD, KEVIN, MD  Primary Cardiologist: Nona DellSamuel McDowell, MD   Chief Complaint  Patient presents with  . Coronary Artery Disease    History of Present Illness: Andrea Hughes is a 59 y.o. female last seen in June. She is here with her husband today for a routine follow-up visit. From a cardiac perspective, she has been stable without active angina symptoms on medical therapy. She does not exercise at this time, we did discuss considering a walking regimen. Part of the problem is her schedule, she works third shift, sleeps most of the day.  She had lab work with Dr. Dimas AguasHoward in the interim, reviewed below. LDL was well controlled at 70 on Lipitor, HDL also 65. She reports no intolerances.  We reviewed her other medications. She has continued on aspirin and Effient over the last year. DES intervention to the LAD was in October 2015. Plan will be to stop Effient at this time.  Blood pressure is well controlled today. She continues on Hyzaar, Lopressor, and Imdur.  Blood sugars are followed by Dr. Dimas AguasHoward. Her recent hemoglobin A1c was 6.4. She is not on insulin.    Past Medical History  Diagnosis Date  . Essential hypertension   . Type 2 diabetes mellitus (HCC)   . Depression   . Hyperlipidemia   . Morbid obesity (HCC)   . CAD (coronary artery disease)     a. 10/18/2014 NSTEMI, DES to mLAD 10/21/2014 b. 10/23/2014 NSTEMI, cath with patent stent, moderate stenosis in D3 enzyme elevation felt secondary to spasm with mild plaque shift in the diagonal  . NSTEMI (non-ST elevated myocardial infarction) [facility](HCC) October 2015    Past Surgical History  Procedure Laterality Date  . Tubal ligation    . Back surgery  1988  . Cholecystectomy  2004  . Breast surgery      1984  . Tonsillectomy      59 years old  . Colonoscopy  09/07/2011    Procedure: COLONOSCOPY;  Surgeon: Dalia HeadingMark A  Jenkins;  [facility];  Service: Gastroenterology;  Laterality: N/A;  . Vaginal hysterectomy    . Bladder suspension    . Left heart catheterization with coronary angiogram N/A 10/21/2014    Procedure: LEFT HEART CATHETERIZATION WITH CORONARY ANGIOGRAM;  Surgeon: Marykay Lexavid W Harding, MD;  [facility];  Service: Cardiovascular;  Laterality: N/A;  . Left heart catheterization with coronary angiogram N/A 10/24/2014    Procedure: LEFT HEART CATHETERIZATION WITH CORONARY ANGIOGRAM;  Surgeon: Marykay Lexavid W Harding, MD;  [facility];  Service: Cardiovascular;  Laterality: N/A;    Current Outpatient Prescriptions  Medication Sig Dispense Refill  . acetaminophen (TYLENOL) 500 MG tablet Take 500 mg by mouth every 6 (six) hours as needed.    Marland Kitchen. aspirin EC 81 MG EC tablet Take 1 tablet (81 mg total) by mouth daily.    Marland Kitchen. atorvastatin (LIPITOR) 80 MG tablet Take 1 tablet (80 mg total) by mouth daily at 6 PM. 30 tablet 11  . Cholecalciferol (VITAMIN D PO) Take 1 tablet by mouth daily.    . clonazePAM (KLONOPIN) 1 MG tablet Take 1 mg by mouth as needed for anxiety.    . Diphenhydramine-APAP, sleep, (TYLENOL PM EXTRA STRENGTH PO) Take 1 tablet by mouth daily as needed (for pain).     Marland Kitchen. escitalopram (LEXAPRO) 10 MG tablet Take 10 mg  by mouth daily.    . isosorbide mononitrate (IMDUR) 60 MG 24 hr tablet Take 1 tablet (60 mg total) by mouth daily. 90 tablet 3  . losartan-hydrochlorothiazide (HYZAAR) 100-12.5 MG per tablet Take 1 tablet by mouth daily.     . metFORMIN (GLUCOPHAGE XR) 500 MG 24 hr tablet Take 4 tablets (2,000 mg total) by mouth daily with breakfast. HOLD for 48 hours, restart on 10/27/2014. 120 tablet 11  . metoprolol (LOPRESSOR) 100 MG tablet Take 1 tablet (100 mg total) by mouth 2 (two) times daily. 180 tablet 3  . nitroGLYCERIN (NITROSTAT) 0.4 MG SL tablet Place 1 tablet (0.4 mg total) under the tongue every 5 (five) minutes x 3 doses as needed for chest pain. 25 tablet 12    . TRULICITY 0.75 MG/0.5ML SOPN Inject 0.75 mg into the skin every 7 (seven) days. On Sunday     No current facility-administered medications for this visit.    Allergies:  Contrast media; Sulfa drugs cross reactors; and Penicillins   Social History: The patient  reports that she has never smoked. She has never used smokeless tobacco. She reports that she does not drink alcohol or use illicit drugs.   ROS:  Please see the history of present illness. Otherwise, complete review of systems is positive for arthritic pains.  All other systems are reviewed and negative.   Physical Exam: VS:  BP 126/75 mmHg  Pulse 62  Ht  (1.626 m)  Wt 277 lb 6.4 oz (125.828 kg)  BMI 47.59 kg/m2  SpO2 97%, BMI Body mass index is 47.59 kg/(m^2).  Wt Readings from Last 3 Encounters:  11/19/15 277 lb 6.4 oz (125.828 kg)  05/28/15 246 lb (111.585 kg)  02/20/15 250 lb (113.399 kg)     Obese woman, appears comfortable at rest. HEENT: Conjunctiva and lids normal, oropharynx clear. Neck: Supple, no elevated JVP or carotid bruits, no thyromegaly. Lungs: Clear to auscultation, nonlabored breathing at rest. Cardiac: Regular rate and rhythm, no S3 or significant systolic murmur, no pericardial rub. Abdomen: Soft, nontender, bowel sounds present. Extremities: No leg pitting edema, distal pulses 2+. Skin: Warm and dry. Musculoskeletal: No kyphosis. Neuropsychiatric: Alert and oriented 3, affect appropriate, tearful describing her symptoms.   ECG: Tracing from 02/20/2015 showed normal sinus rhythm with leftward axis.Marland Kitchen   Recent Labwork:  August 2016: Cholesterol 150, triglycerides 73, HDL 65, LDL 70, BUN 16, creatinine 0.6, potassium 4.0, AST 12, ALT 22, hemoglobin A1c 6.4  Other Studies Reviewed Today:  Echocardiogram 10/31/2014: Study Conclusions  - Left ventricle: The cavity size was normal. Wall thickness was increased in a pattern of mild LVH. Systolic function was normal. The estimated  ejection fraction was in the range of 55% to 60%. Incidentally noted transverse false tendon near the LV apex. There is mild hypokinesis of the apicalanteroseptal myocardium. Doppler parameters are consistent with abnormal left ventricular relaxation (grade 1 diastolic dysfunction). - Aortic valve: Trileaflet. Nodular thickening of the noncoronary cusp. There was no significant regurgitation. - Mitral valve: Calcified annulus. There was trivial regurgitation. - Right atrium: Central venous pressure (est): 3 mm Hg. - Atrial septum: No defect or patent foramen ovale was identified. - Tricuspid valve: There was trivial regurgitation. - Pulmonary arteries: Systolic pressure could not be accurately estimated. - Pericardium, extracardiac: There was no pericardial effusion.  Impressions:  - Mild LVH with LVEF 55-60%, mild hypokinesis of the apical anteroseptal wall, grade 1 diastolic dysfunction. Mild nodular thickening of the noncoronary aortic cusp. No aortic regurgitation. Trivial  tricuspid regurgitation, unable to assess PASP.  Assessment and Plan:  1. Symptomatically stable CAD status post DES to the LAD in October 2015. She will continue current medical regimen except to discontinue Effient. I encouraged regular exercise plan such as walking regimen.   2. Hyperlipidemia, well controlled on Lipitor. Recent LDL and HDL noted above. We made no changes today.  3. Essential hypertension, blood pressure is well controlled on current regimen.  4. Type 2 diabetes mellitus, followed by Dr. Dimas Aguas. Hemoglobin A1c 6.4%.  Current medicines were reviewed with the patient today.  Disposition: FU with me in 6 months.   Signed, Jonelle Sidle, MD, [facility] 11/19/2015 9:30 AM    [facility] at [facility] [address] [place], [place], [place] [address] Phone: [phone]; Fax: [phone]

## 2015-12-02 ENCOUNTER — Other Ambulatory Visit: Payer: Self-pay | Admitting: Physician Assistant

## 2015-12-11 ENCOUNTER — Other Ambulatory Visit: Payer: Self-pay | Admitting: Physician Assistant

## 2016-03-08 ENCOUNTER — Other Ambulatory Visit: Payer: Self-pay | Admitting: Cardiology

## 2016-05-12 ENCOUNTER — Encounter: Payer: Self-pay | Admitting: Cardiology

## 2016-05-12 ENCOUNTER — Encounter: Payer: Self-pay | Admitting: *Deleted

## 2016-05-12 ENCOUNTER — Ambulatory Visit (INDEPENDENT_AMBULATORY_CARE_PROVIDER_SITE_OTHER): Payer: 59 | Admitting: Cardiology

## 2016-05-12 VITALS — BP 142/92 | HR 82 | Ht 65.0 in | Wt 276.0 lb

## 2016-05-12 DIAGNOSIS — I10 Essential (primary) hypertension: Secondary | ICD-10-CM | POA: Diagnosis not present

## 2016-05-12 DIAGNOSIS — E782 Mixed hyperlipidemia: Secondary | ICD-10-CM | POA: Diagnosis not present

## 2016-05-12 DIAGNOSIS — E1159 Type 2 diabetes mellitus with other circulatory complications: Secondary | ICD-10-CM

## 2016-05-12 DIAGNOSIS — I2510 Atherosclerotic heart disease of native coronary artery without angina pectoris: Secondary | ICD-10-CM

## 2016-05-12 MED ORDER — NITROGLYCERIN 0.4 MG SL SUBL: 0.4000 mg | SUBLINGUAL_TABLET | SUBLINGUAL | Status: DC | PRN

## 2016-05-12 NOTE — Progress Notes (Signed)
Cardiology Office Note  Date: 05/12/2016   ID: Andrea Hughes, DOB 06-Mar-1956, MRN [id number]  PCP: Selinda Flavin, MD  Primary Cardiologist: Nona Dell, MD   Chief Complaint  Patient presents with  . Coronary Artery Disease    History of Present Illness: Andrea Hughes is a 60 y.o. female last seen in November 2016. She presents for a follow-up visit today with her husband. Reports no angina symptoms or nitroglycerin use. States that her mother just recently passed away, she seems to be handling things reasonably well at this point.  I reviewed her medications which are outlined below. Effient was stopped at the last visit. She otherwise continues on aspirin, Lipitor, Imdur, Hyzaar, Lopressor, and has as needed nitroglycerin.  She reports having had lab work done in the interim by Dr. Dimas Aguas, we are requesting the results.  ECG today shows sinus bradycardia with low voltage in the precordial leads, decreased R wave progression, and nonspecific T-wave changes.  Past Medical History  Diagnosis Date  . Essential hypertension   . Type 2 diabetes mellitus (HCC)   . Depression   . Hyperlipidemia   . Morbid obesity (HCC)   . CAD (coronary artery disease)     a. 10/18/2014 NSTEMI, DES to mLAD 10/21/2014 b. 10/23/2014 NSTEMI, cath with patent stent, moderate stenosis in D3 enzyme elevation felt secondary to spasm with mild plaque shift in the diagonal  . NSTEMI (non-ST elevated myocardial infarction) [facility] October 2015    Past Surgical History  Procedure Laterality Date  . Tubal ligation    . Back surgery  1988  . Cholecystectomy  2004  . Breast surgery      1984  . Tonsillectomy      60 years old  . Colonoscopy  09/07/2011    Procedure: COLONOSCOPY;  Surgeon: Dalia Heading;  [facility];  Service: Gastroenterology;  Laterality: N/A;  . Vaginal hysterectomy    . Bladder suspension    . Left heart catheterization with coronary angiogram N/A 10/21/2014   Procedure: LEFT HEART CATHETERIZATION WITH CORONARY ANGIOGRAM;  Surgeon: Marykay Lex, MD;  Location: [facility] CATH LAB;  Service: Cardiovascular;  Laterality: N/A;  . Left heart catheterization with coronary angiogram N/A 10/24/2014    Procedure: LEFT HEART CATHETERIZATION WITH CORONARY ANGIOGRAM;  Surgeon: Marykay Lex, MD;  Location: [facility] CATH LAB;  Service: Cardiovascular;  Laterality: N/A;    Current Outpatient Prescriptions  Medication Sig Dispense Refill  . acetaminophen (TYLENOL) 500 MG tablet Take 500 mg by mouth every 6 (six) hours as needed.    Marland Kitchen aspirin EC 81 MG EC tablet Take 1 tablet (81 mg total) by mouth daily.    Marland Kitchen atorvastatin (LIPITOR) 80 MG tablet TAKE ONE TABLET BY MOUTH ONCE DAILY AT  6PM 90 tablet 3  . clonazePAM (KLONOPIN) 1 MG tablet Take 1 mg by mouth as needed for anxiety.    . Diphenhydramine-APAP, sleep, (TYLENOL PM EXTRA STRENGTH PO) Take 1 tablet by mouth daily as needed (for pain).     Marland Kitchen escitalopram (LEXAPRO) 10 MG tablet Take 10 mg by mouth daily.    . isosorbide mononitrate (IMDUR) 60 MG 24 hr tablet TAKE ONE TABLET BY MOUTH ONCE DAILY **DOSE  INCREASE** 90 tablet 0  . losartan-hydrochlorothiazide (HYZAAR) 100-12.5 MG per tablet Take 1 tablet by mouth daily.     . metFORMIN (GLUCOPHAGE XR) 500 MG 24 hr tablet Take 4 tablets (2,000 mg total) by mouth daily with breakfast. HOLD for  48 hours, restart on 10/27/2014. 120 tablet 11  . metoprolol (LOPRESSOR) 100 MG tablet TAKE ONE TABLET BY MOUTH TWICE DAILY 180 tablet 3  . nitroGLYCERIN (NITROSTAT) 0.4 MG SL tablet Place 1 tablet (0.4 mg total) under the tongue every 5 (five) minutes x 3 doses as needed for chest pain. 25 tablet 12  . TRULICITY 0.75 MG/0.5ML SOPN Inject 0.75 mg into the skin every 7 (seven) days. On Sunday     No current facility-administered medications for this visit.   Allergies:  Contrast media; Sulfa drugs cross reactors; and Penicillins   Social History: The patient  reports that she has never  smoked. She has never used smokeless tobacco. She reports that she does not drink alcohol or use illicit drugs.   ROS:  Please see the history of present illness. Otherwise, complete review of systems is positive for fatigue, chronic back pain.  All other systems are reviewed and negative.   Physical Exam: VS:  BP 142/92 mmHg  Pulse 82  Ht  (1.651 m)  Wt 276 lb (125.193 kg)  BMI 45.93 kg/m2  SpO2 96%, BMI Body mass index is 45.93 kg/(m^2).  Wt Readings from Last 3 Encounters:  05/12/16 276 lb (125.193 kg)  11/19/15 277 lb 6.4 oz (125.828 kg)  05/28/15 246 lb (111.585 kg)    Obese woman, appears comfortable at rest. HEENT: Conjunctiva and lids normal, oropharynx clear. Neck: Supple, no elevated JVP or carotid bruits, no thyromegaly. Lungs: Clear to auscultation, nonlabored breathing at rest. Cardiac: Regular rate and rhythm, no S3 or significant systolic murmur, no pericardial rub. Abdomen: Soft, nontender, bowel sounds present. Extremities: No leg pitting edema, distal pulses 2+. Skin: Warm and dry. Musculoskeletal: No kyphosis. Neuropsychiatric: Alert and oriented 3, affect appropriate, tearful describing her symptoms.  ECG: I personally reviewed the prior tracing from 02/20/2015 which showed sinus rhythm with leftward axis.  Recent Labwork:    Component Value Date/Time   CHOL 117 12/03/2014 0911   TRIG 76 12/03/2014 0911   HDL 36* 12/03/2014 0911   CHOLHDL 3.3 12/03/2014 0911   VLDL 15 12/03/2014 0911   LDLCALC 66 12/03/2014 0911  August 2016: Cholesterol 150, triglycerides 73, HDL 65, LDL 70, BUN 16, creatinine 0.6, potassium 4.0, AST 12, ALT 22, hemoglobin A1c 6.4  Other Studies Reviewed Today:   Echocardiogram 10/31/2014: Study Conclusions  - Left ventricle: The cavity size was normal. Wall thickness was increased in a pattern of mild LVH. Systolic function was normal. The estimated ejection fraction was in the range of 55% to 60%. Incidentally noted  transverse false tendon near the LV apex. There is mild hypokinesis of the apicalanteroseptal myocardium. Doppler parameters are consistent with abnormal left ventricular relaxation (grade 1 diastolic dysfunction). - Aortic valve: Trileaflet. Nodular thickening of the noncoronary cusp. There was no significant regurgitation. - Mitral valve: Calcified annulus. There was trivial regurgitation. - Right atrium: Central venous pressure (est): 3 mm Hg. - Atrial septum: No defect or patent foramen ovale was identified. - Tricuspid valve: There was trivial regurgitation. - Pulmonary arteries: Systolic pressure could not be accurately estimated. - Pericardium, extracardiac: There was no pericardial effusion.  Impressions:  - Mild LVH with LVEF 55-60%, mild hypokinesis of the apical anteroseptal wall, grade 1 diastolic dysfunction. Mild nodular thickening of the noncoronary aortic cusp. No aortic regurgitation. Trivial tricuspid regurgitation, unable to assess PASP.  Assessment and Plan:  1. CAD status post DES to the LAD in October 2015. Symptomatically stable without angina. Plan to continue  current medical regimen and observation, ECG reviewed.  2. Hyperlipidemia, on Lipitor. Requesting most recent lab work from Dr. Dimas AguasHoward.  3. Essential hypertension, continues on Hyzaar and Lopressor. No changes made to current regimen. Weight loss would be beneficial.  4. Type 2 diabetes mellitus, followed by Dr. Dimas AguasHoward on Glucophage. Hemoglobin A1c was 6.4% last year.  Current medicines were reviewed with the patient today.   Orders Placed This Encounter  Procedures  . EKG 12-Lead    Disposition: FU with me in 6 months.   Signed, Jonelle SidleSamuel G. McDowell, MD, [facility] 05/12/2016 9:51 AM    [facility] [address], [place], [place] [address] Phone: [phone]; Fax: [phone]

## 2016-05-12 NOTE — Patient Instructions (Signed)
Your physician recommends that you continue on your current medications as directed. Please refer to the Current Medication list given to you today. Your physician recommends that you schedule a follow-up appointment in: 6 months. You will receive a reminder letter in the mail in about 4 months reminding you to call and schedule your appointment. If you don't receive this letter, please contact our office. 

## 2016-06-09 ENCOUNTER — Telehealth: Payer: Self-pay | Admitting: *Deleted

## 2016-06-09 NOTE — Telephone Encounter (Signed)
Patient c/o elevated BP 148/101, headache and some chest tightness rated 2/10 yesterday and Sunday. No c/o chest tightness today. No c/o fever, sob, n/v, diarrhea, or dizziness. Patient did admit that she ate ham on Saturday but wasn't aware of anything else in her diet that would cause her BP to be elevated. Patient said her chest tightness wasn't bad enough to take nitroglycerin. Cardiac medications reviewed while on phone with patient. Recheck BP today was 148/93. Patient said that she does not check her BP regularly. Patient advised to contact her PCP for an appointment, also advised to check and record her BP daily using the same cuff, same arm and the same time of the day. An appointment was arranged for patient to be seen on 06/17/16. Patient advised that if her symptoms got worse, that she needed to proceed to the ED for an evaluation. Patient verbalized understanding of plan.

## 2016-06-17 ENCOUNTER — Other Ambulatory Visit: Payer: Self-pay | Admitting: *Deleted

## 2016-06-17 ENCOUNTER — Encounter: Payer: Self-pay | Admitting: Cardiology

## 2016-06-17 ENCOUNTER — Ambulatory Visit (INDEPENDENT_AMBULATORY_CARE_PROVIDER_SITE_OTHER): Payer: 59 | Admitting: Cardiology

## 2016-06-17 ENCOUNTER — Telehealth: Payer: Self-pay | Admitting: Cardiology

## 2016-06-17 ENCOUNTER — Encounter: Payer: Self-pay | Admitting: *Deleted

## 2016-06-17 ENCOUNTER — Other Ambulatory Visit: Payer: Self-pay | Admitting: Cardiology

## 2016-06-17 VITALS — BP 160/80 | HR 58 | Ht 65.0 in | Wt 278.4 lb

## 2016-06-17 DIAGNOSIS — I25119 Atherosclerotic heart disease of native coronary artery with unspecified angina pectoris: Secondary | ICD-10-CM

## 2016-06-17 DIAGNOSIS — I200 Unstable angina: Secondary | ICD-10-CM

## 2016-06-17 DIAGNOSIS — E1159 Type 2 diabetes mellitus with other circulatory complications: Secondary | ICD-10-CM | POA: Diagnosis not present

## 2016-06-17 DIAGNOSIS — I10 Essential (primary) hypertension: Secondary | ICD-10-CM

## 2016-06-17 DIAGNOSIS — E782 Mixed hyperlipidemia: Secondary | ICD-10-CM | POA: Diagnosis not present

## 2016-06-17 MED ORDER — PREDNISONE 20 MG PO TABS: 60.0000 mg | ORAL_TABLET | ORAL | Status: AC

## 2016-06-17 MED ORDER — RANITIDINE HCL 150 MG PO TABS: 150.0000 mg | ORAL_TABLET | ORAL | Status: DC

## 2016-06-17 MED ORDER — DIPHENHYDRAMINE HCL 25 MG PO TABS: 25.0000 mg | ORAL_TABLET | ORAL | Status: DC

## 2016-06-17 NOTE — Patient Instructions (Signed)
Medication Instructions:  Your physician recommends that you continue on your current medications as directed. Please refer to the Current Medication list given to you today.   Labwork: NONE  Testing/Procedures: NONE  Follow-Up: Your physician recommends that you schedule a follow-up appointment in: 1 month   Any Other Special Instructions Will Be Listed Below (If Applicable).     If you need a refill on your cardiac medications before your next appointment, please call your pharmacy.   

## 2016-06-17 NOTE — Telephone Encounter (Signed)
Left heart cath Thursday 06/24/16 @10 :30 am arrive @8 :30 am with Dr. Clifton JamesMcAlhany dx: accelerating angina Checking percert

## 2016-06-17 NOTE — Progress Notes (Signed)
  Cardiology Office Note  Date: 06/17/2016   ID: Andrea Hughes, DOB 07/24/1956, MRN [id number]  PCP: HOWARD, KEVIN, MD  Primary Cardiologist: Keelen Quevedo, MD   Chief Complaint  Patient presents with  . Coronary Artery Disease    History of Present Illness: Andrea Hughes is a 60 y.o. female just recently seen in May. At that time she was relatively stable from a cardiac perspective, but under a lot of stress since the passing of her mother. Over the last 2 weeks she has been having increasing episodes of chest tightness reminiscent of her angina. She called the office after one of these episodes with significant blood pressure elevation. She states that this has happened with activity such as walking down to a pond behind her house, she has had to stop more frequently. She has not tried nitroglycerin. Reports compliance with her medications. Very scared about her symptoms.  I reviewed her ECG today which shows sinus bradycardia with poor R-wave progression, rule out old anterior infarct pattern.  Last cardiac catheterization was in 2015 in the setting of NSTEMI and showed patent LAD stent site with moderately stenosed third diagonal branch. Event was felt to be potentially due to spasm or plaque shift. She did not require intervention at that time.  Past Medical History  Diagnosis Date  . Essential hypertension   . Type 2 diabetes mellitus (HCC)   . Depression   . Hyperlipidemia   . Morbid obesity (HCC)   . CAD (coronary artery disease)     a. 10/18/2014 NSTEMI, DES to mLAD 10/21/2014 b. 10/23/2014 NSTEMI, cath with patent stent, moderate stenosis in D3 enzyme elevation felt secondary to spasm with mild plaque shift in the diagonal  . NSTEMI (non-ST elevated myocardial infarction) (HCC) October 2015    Past Surgical History  Procedure Laterality Date  . Tubal ligation    . Back surgery  1988  . Cholecystectomy  2004  . Breast surgery      1984  . Tonsillectomy     60 years old  . Colonoscopy  09/07/2011    Procedure: COLONOSCOPY;  Surgeon: Mark A Jenkins;  [facility]: [facility];  Service: Gastroenterology;  Laterality: N/A;  . Vaginal hysterectomy    . Bladder suspension    . Left heart catheterization with coronary angiogram N/A 10/21/2014    Procedure: LEFT HEART CATHETERIZATION WITH CORONARY ANGIOGRAM;  Surgeon: David W Harding, MD;  Location: [facility];  Service: Cardiovascular;  Laterality: N/A;  . Left heart catheterization with coronary angiogram N/A 10/24/2014    Procedure: LEFT HEART CATHETERIZATION WITH CORONARY ANGIOGRAM;  Surgeon: David W Harding, MD;  Location: [facility] CATH [facility];  Service: Cardiovascular;  Laterality: N/A;    Current Outpatient Prescriptions  Medication Sig Dispense Refill  . acetaminophen (TYLENOL) 500 MG tablet Take 500 mg by mouth every 6 (six) hours as needed.    . aspirin EC 81 MG EC tablet Take 1 tablet (81 mg total) by mouth daily.    . atorvastatin (LIPITOR) 80 MG tablet TAKE ONE TABLET BY MOUTH ONCE DAILY AT  6PM 90 tablet 3  . clonazePAM (KLONOPIN) 1 MG tablet Take 1 mg by mouth as needed for anxiety.    . Diphenhydramine-APAP, sleep, (TYLENOL PM EXTRA STRENGTH PO) Take 1 tablet by mouth daily as needed (for pain).     . escitalopram (LEXAPRO) 10 MG tablet Take 10 mg by mouth daily.    . isosorbide mononitrate (IMDUR) 60 MG 24 hr tablet   TAKE ONE TABLET BY MOUTH ONCE DAILY **DOSE  INCREASE** 90 tablet 0  . losartan-hydrochlorothiazide (HYZAAR) 100-12.5 MG per tablet Take 1 tablet by mouth daily.     . metFORMIN (GLUCOPHAGE XR) 500 MG 24 hr tablet Take 4 tablets (2,000 mg total) by mouth daily with breakfast. HOLD for 48 hours, restart on 10/27/2014. 120 tablet 11  . metoprolol (LOPRESSOR) 100 MG tablet TAKE ONE TABLET BY MOUTH TWICE DAILY 180 tablet 3  . nitroGLYCERIN (NITROSTAT) 0.4 MG SL tablet Place 1 tablet (0.4 mg total) under the tongue every 5 (five) minutes x 3 doses as needed for chest pain. If no relief  after 3rd dose, proceed to the ED for an evaluation 25 tablet 12  . TRULICITY 0.75 MG/0.5ML SOPN Inject 0.75 mg into the skin every 7 (seven) days. On Sunday     No current facility-administered medications for this visit.   Allergies:  Contrast media; Sulfa drugs cross reactors; and Penicillins   Social History: The patient  reports that she has never smoked. She has never used smokeless tobacco. She reports that she does not drink alcohol or use illicit drugs.   Family History: The patient's family history includes Cancer in her father; Heart disease in her mother.   ROS:  Please see the history of present illness. Otherwise, complete review of systems is positive for fatigue, dyspnea on exertion.  All other systems are reviewed and negative.   Physical Exam: VS:  BP 160/80 mmHg  Pulse 58  Ht 5\' 5"  (1.651 m)  Wt 278 lb 6.4 oz (126.281 kg)  BMI 46.33 kg/m2  SpO2 97%, BMI Body mass index is 46.33 kg/(m^2).  Wt Readings from Last 3 Encounters:  06/17/16 278 lb 6.4 oz (126.281 kg)  05/12/16 276 lb (125.193 kg)  11/19/15 277 lb 6.4 oz (125.828 kg)    Obese woman, appears comfortable at rest. HEENT: Conjunctiva and lids normal, oropharynx clear. Neck: Supple, no elevated JVP or carotid bruits, no thyromegaly. Lungs: Clear to auscultation, nonlabored breathing at rest. Cardiac: Regular rate and rhythm, no S3 or significant systolic murmur, no pericardial rub. Abdomen: Soft, nontender, bowel sounds present. Extremities: No leg pitting edema, distal pulses 2+. Skin: Warm and dry. Musculoskeletal: No kyphosis. Neuropsychiatric: Alert and oriented 3, affect appropriate, tearful describing her symptoms.  ECG: I personally reviewed the tracing from 05/12/2016 showed sinus bradycardia with increased voltage and probable old anterior infarct pattern.  Recent Labwork:    Component Value Date/Time   CHOL 117 12/03/2014 0911   TRIG 76 12/03/2014 0911   HDL 36* 12/03/2014 0911   CHOLHDL  3.3 12/03/2014 0911   VLDL 15 12/03/2014 0911   LDLCALC 66 12/03/2014 0911  August 2016: Cholesterol 150, triglycerides 73, HDL 65, LDL 70, BUN 16, creatinine 0.6, potassium 4.0, AST 12, ALT 22, hemoglobin A1c 6.4  Other Studies Reviewed Today:  Echocardiogram 10/31/2014: Study Conclusions  - Left ventricle: The cavity size was normal. Wall thickness was increased in a pattern of mild LVH. Systolic function was normal. The estimated ejection fraction was in the range of 55% to 60%. Incidentally noted transverse false tendon near the LV apex. There is mild hypokinesis of the apicalanteroseptal myocardium. Doppler parameters are consistent with abnormal left ventricular relaxation (grade 1 diastolic dysfunction). - Aortic valve: Trileaflet. Nodular thickening of the noncoronary cusp. There was no significant regurgitation. - Mitral valve: Calcified annulus. There was trivial regurgitation. - Right atrium: Central venous pressure (est): 3 mm Hg. - Atrial septum: No defect or  patent foramen ovale was identified. - Tricuspid valve: There was trivial regurgitation. - Pulmonary arteries: Systolic pressure could not be accurately estimated. - Pericardium, extracardiac: There was no pericardial effusion.  Impressions:  - Mild LVH with LVEF 55-60%, mild hypokinesis of the apical anteroseptal wall, grade 1 diastolic dysfunction. Mild nodular thickening of the noncoronary aortic cusp. No aortic regurgitation. Trivial tricuspid regurgitation, unable to assess PASP.  Assessment and Plan:  1. Patient reporting accelerating angina symptoms on stable medical regimen. She has a history of DES to the LAD in October 2015, moderate third diagonal disease subsequently noted in the setting of NSTEMI. ECG does not show acute ST segment change today. We have discussed options for further evaluation, and plan after discussion is to arrange a diagnostic cardiac catheterization to  most clearly reevaluate coronary anatomy. We have discussed the risks and benefits, and she is in agreement to proceed.  2. Hyperlipidemia, on Lipitor. Last LDL 70.  3. Essential hypertension, blood pressure trend has been up recently in association with her symptoms.  4. Type 2 diabetes mellitus, on Glucophage per Dr. Dimas AguasHoward.  Current medicines were reviewed with the patient today.   Orders Placed This Encounter  Procedures  . EKG 12-Lead    Disposition: Follow-up with me after cardiac catheterization.  Signed, Jonelle SidleSamuel G. Mehki Klumpp, MD, [facility] 06/17/2016 3:02 PM     [facility] [address], [place], [place] [address] Phone: [phone]; Fax: [phone]

## 2016-06-24 ENCOUNTER — Encounter (HOSPITAL_COMMUNITY)
Admission: RE | Disposition: A | Discharge status: Home / Self Care | Payer: Self-pay | Source: Ambulatory Visit | Attending: Cardiovascular Disease

## 2016-06-24 ENCOUNTER — Ambulatory Visit (HOSPITAL_COMMUNITY)
Admission: RE | Admit: 2016-06-24 | Discharge: 2016-06-24 | Disposition: A | Discharge status: Home / Self Care | Payer: 59 | Source: Ambulatory Visit | Attending: Cardiovascular Disease | Admitting: Cardiovascular Disease

## 2016-06-24 DIAGNOSIS — I200 Unstable angina: Secondary | ICD-10-CM | POA: Diagnosis present

## 2016-06-24 DIAGNOSIS — E119 Type 2 diabetes mellitus without complications: Secondary | ICD-10-CM | POA: Diagnosis not present

## 2016-06-24 DIAGNOSIS — I25110 Atherosclerotic heart disease of native coronary artery with unstable angina pectoris: Secondary | ICD-10-CM | POA: Diagnosis present

## 2016-06-24 DIAGNOSIS — F329 Major depressive disorder, single episode, unspecified: Secondary | ICD-10-CM | POA: Insufficient documentation

## 2016-06-24 DIAGNOSIS — Z6841 Body Mass Index (BMI) 40.0 and over, adult: Secondary | ICD-10-CM | POA: Insufficient documentation

## 2016-06-24 DIAGNOSIS — Z7984 Long term (current) use of oral hypoglycemic drugs: Secondary | ICD-10-CM | POA: Diagnosis not present

## 2016-06-24 DIAGNOSIS — Z955 Presence of coronary angioplasty implant and graft: Secondary | ICD-10-CM | POA: Insufficient documentation

## 2016-06-24 DIAGNOSIS — E785 Hyperlipidemia, unspecified: Secondary | ICD-10-CM | POA: Diagnosis not present

## 2016-06-24 DIAGNOSIS — E1159 Type 2 diabetes mellitus with other circulatory complications: Secondary | ICD-10-CM | POA: Diagnosis present

## 2016-06-24 DIAGNOSIS — Z8249 Family history of ischemic heart disease and other diseases of the circulatory system: Secondary | ICD-10-CM | POA: Insufficient documentation

## 2016-06-24 DIAGNOSIS — I10 Essential (primary) hypertension: Secondary | ICD-10-CM | POA: Diagnosis present

## 2016-06-24 DIAGNOSIS — I252 Old myocardial infarction: Secondary | ICD-10-CM | POA: Insufficient documentation

## 2016-06-24 DIAGNOSIS — E782 Mixed hyperlipidemia: Secondary | ICD-10-CM | POA: Diagnosis present

## 2016-06-24 DIAGNOSIS — Z79899 Other long term (current) drug therapy: Secondary | ICD-10-CM | POA: Insufficient documentation

## 2016-06-24 DIAGNOSIS — Z7982 Long term (current) use of aspirin: Secondary | ICD-10-CM | POA: Insufficient documentation

## 2016-06-24 HISTORY — PX: CARDIAC CATHETERIZATION: SHX172

## 2016-06-24 LAB — BASIC METABOLIC PANEL
Anion gap: 7 (ref 5–15)
BUN: 13 mg/dL (ref 6–20)
CALCIUM: 9.6 mg/dL (ref 8.9–10.3)
CHLORIDE: 106 mmol/L (ref 101–111)
CO2: 27 mmol/L (ref 22–32)
CREATININE: 0.66 mg/dL (ref 0.44–1.00)
GFR calc non Af Amer: >60 mL/min (ref >60)
Glucose, Bld: 192 mg/dL — ABNORMAL HIGH (ref 65–99)
Potassium: 3.8 mmol/L (ref 3.5–5.1)
SODIUM: 140 mmol/L (ref 135–145)

## 2016-06-24 LAB — PROTIME-INR
INR: 0.98 (ref 0.00–1.49)
PROTHROMBIN TIME: 13.2 s (ref 11.6–15.2)

## 2016-06-24 LAB — CBC
HCT: 37.6 % (ref 36.0–46.0)
Hemoglobin: 12.4 g/dL (ref 12.0–15.0)
MCH: 27.4 pg (ref 26.0–34.0)
MCHC: 33 g/dL (ref 30.0–36.0)
MCV: 83.2 fL (ref 78.0–100.0)
PLATELETS: 196 10*3/uL (ref 150–400)
RBC: 4.52 MIL/uL (ref 3.87–5.11)
RDW: 13.7 % (ref 11.5–15.5)
WBC: 9.1 10*3/uL (ref 4.0–10.5)

## 2016-06-24 LAB — GLUCOSE, CAPILLARY: GLUCOSE-CAPILLARY: 193 mg/dL — AB (ref 65–99)

## 2016-06-24 SURGERY — LEFT HEART CATH AND CORONARY ANGIOGRAPHY
Anesthesia: LOCAL

## 2016-06-24 MED ORDER — ONDANSETRON HCL 4 MG/2ML IJ SOLN
4.0000 mg | Freq: Four times a day (QID) | INTRAMUSCULAR | Status: DC | PRN
Start: 2016-06-24 — End: 2016-06-24

## 2016-06-24 MED ORDER — HEPARIN SODIUM (PORCINE) 1000 UNIT/ML IJ SOLN
INTRAMUSCULAR | Status: AC
  Filled 2016-06-24: qty 1

## 2016-06-24 MED ORDER — DIPHENHYDRAMINE HCL 50 MG/ML IJ SOLN
INTRAMUSCULAR | Status: DC | PRN
  Administered 2016-06-24: 25 mg via INTRAVENOUS

## 2016-06-24 MED ORDER — HEPARIN (PORCINE) IN NACL 2-0.9 UNIT/ML-% IJ SOLN
INTRAMUSCULAR | Status: AC
  Filled 2016-06-24: qty 1000

## 2016-06-24 MED ORDER — SODIUM CHLORIDE 0.9% FLUSH: 3.0000 mL | Freq: Two times a day (BID) | INTRAVENOUS | Status: DC

## 2016-06-24 MED ORDER — SODIUM CHLORIDE 0.9% FLUSH: 3.0000 mL | INTRAVENOUS | Status: DC | PRN

## 2016-06-24 MED ORDER — HEPARIN SODIUM (PORCINE) 1000 UNIT/ML IJ SOLN
INTRAMUSCULAR | Status: DC | PRN
  Administered 2016-06-24: 6000 [IU] via INTRAVENOUS

## 2016-06-24 MED ORDER — FENTANYL CITRATE (PF) 100 MCG/2ML IJ SOLN
INTRAMUSCULAR | Status: DC | PRN
  Administered 2016-06-24 (×2): 25 ug via INTRAVENOUS

## 2016-06-24 MED ORDER — DIPHENHYDRAMINE HCL 50 MG/ML IJ SOLN
INTRAMUSCULAR | Status: AC
  Filled 2016-06-24: qty 1

## 2016-06-24 MED ORDER — MORPHINE SULFATE (PF) 2 MG/ML IV SOLN: 2.0000 mg | INTRAVENOUS | Status: DC | PRN

## 2016-06-24 MED ORDER — METHYLPREDNISOLONE SODIUM SUCC 125 MG IJ SOLR
INTRAMUSCULAR | Status: DC | PRN
  Administered 2016-06-24: 125 mg via INTRAVENOUS

## 2016-06-24 MED ORDER — ASPIRIN 81 MG PO CHEW: 81.0000 mg | CHEWABLE_TABLET | ORAL | Status: DC

## 2016-06-24 MED ORDER — HEPARIN (PORCINE) IN NACL 2-0.9 UNIT/ML-% IJ SOLN
INTRAMUSCULAR | Status: DC | PRN
  Administered 2016-06-24: 1000 mL

## 2016-06-24 MED ORDER — ACETAMINOPHEN 325 MG PO TABS: 650.0000 mg | ORAL_TABLET | ORAL | Status: DC | PRN

## 2016-06-24 MED ORDER — LIDOCAINE HCL (PF) 1 % IJ SOLN
INTRAMUSCULAR | Status: DC | PRN
  Administered 2016-06-24 (×2): 2 mL via INTRADERMAL

## 2016-06-24 MED ORDER — METHYLPREDNISOLONE SODIUM SUCC 125 MG IJ SOLR
INTRAMUSCULAR | Status: AC
  Filled 2016-06-24: qty 2

## 2016-06-24 MED ORDER — FAMOTIDINE IN NACL 20-0.9 MG/50ML-% IV SOLN
INTRAVENOUS | Status: AC
  Filled 2016-06-24: qty 50

## 2016-06-24 MED ORDER — MIDAZOLAM HCL 2 MG/2ML IJ SOLN
INTRAMUSCULAR | Status: DC | PRN
  Administered 2016-06-24 (×2): 1 mg via INTRAVENOUS

## 2016-06-24 MED ORDER — SODIUM CHLORIDE 0.9 % WEIGHT BASED INFUSION: 3.0000 mL/kg/h | INTRAVENOUS | Status: DC

## 2016-06-24 MED ORDER — IOPAMIDOL (ISOVUE-370) INJECTION 76%
INTRAVENOUS | Status: AC
  Filled 2016-06-24: qty 100

## 2016-06-24 MED ORDER — IOPAMIDOL (ISOVUE-370) INJECTION 76%
INTRAVENOUS | Status: AC
  Filled 2016-06-24: qty 50

## 2016-06-24 MED ORDER — VERAPAMIL HCL 2.5 MG/ML IV SOLN
INTRAVENOUS | Status: AC
  Filled 2016-06-24: qty 2

## 2016-06-24 MED ORDER — LIDOCAINE HCL (PF) 1 % IJ SOLN
INTRAMUSCULAR | Status: AC
  Filled 2016-06-24: qty 30

## 2016-06-24 MED ORDER — FENTANYL CITRATE (PF) 100 MCG/2ML IJ SOLN
INTRAMUSCULAR | Status: AC
  Filled 2016-06-24: qty 2

## 2016-06-24 MED ORDER — IOPAMIDOL (ISOVUE-370) INJECTION 76%
INTRAVENOUS | Status: DC | PRN
  Administered 2016-06-24: 110 mL via INTRAVENOUS

## 2016-06-24 MED ORDER — SODIUM CHLORIDE 0.9 % IV SOLN
INTRAVENOUS | Status: DC
  Administered 2016-06-24: 09:00:00 via INTRAVENOUS

## 2016-06-24 MED ORDER — SODIUM CHLORIDE 0.9 % IV SOLN: 250.0000 mL | INTRAVENOUS | Status: DC | PRN

## 2016-06-24 MED ORDER — FAMOTIDINE IN NACL 20-0.9 MG/50ML-% IV SOLN
INTRAVENOUS | Status: DC | PRN
  Administered 2016-06-24: 20 mg via INTRAVENOUS

## 2016-06-24 MED ORDER — VERAPAMIL HCL 2.5 MG/ML IV SOLN
INTRAVENOUS | Status: DC | PRN
  Administered 2016-06-24: 10 mL via INTRA_ARTERIAL

## 2016-06-24 MED ORDER — MIDAZOLAM HCL 2 MG/2ML IJ SOLN
INTRAMUSCULAR | Status: AC
  Filled 2016-06-24: qty 2

## 2016-06-24 SURGICAL SUPPLY — 12 items
CATH INFINITI 5FR MULTPACK ANG (CATHETERS) ×2 IMPLANT
COVER PRB 48X5XTLSCP FOLD TPE (BAG) ×1 IMPLANT
COVER PROBE 5X48 (BAG) ×1
DEVICE RAD COMP TR BAND LRG (VASCULAR PRODUCTS) ×2 IMPLANT
GLIDESHEATH SLEND A-KIT 6F 22G (SHEATH) ×2 IMPLANT
GLIDESHEATH SLEND SS 6F .021 (SHEATH) ×2 IMPLANT
KIT HEART LEFT (KITS) ×2 IMPLANT
PACK CARDIAC CATHETERIZATION (CUSTOM PROCEDURE TRAY) ×2 IMPLANT
SYR MEDRAD MARK V 150ML (SYRINGE) ×2 IMPLANT
TRANSDUCER W/STOPCOCK (MISCELLANEOUS) ×2 IMPLANT
TUBING CIL FLEX 10 FLL-RA (TUBING) ×2 IMPLANT
WIRE SAFE-T 1.5MM-J .035X260CM (WIRE) ×2 IMPLANT

## 2016-06-24 NOTE — Discharge Instructions (Signed)
Radial Site Care Refer to this sheet in the next few weeks. These instructions provide you with information about caring for yourself after your procedure. Your health care provider may also give you more specific instructions. Your treatment has been planned according to current medical practices, but problems sometimes occur. Call your health care provider if you have any problems or questions after your procedure. WHAT TO EXPECT AFTER THE PROCEDURE After your procedure, it is typical to have the following:  Bruising at the radial site that usually fades within 1-2 weeks.  Blood collecting in the tissue (hematoma) that may be painful to the touch. It should usually decrease in size and tenderness within 1-2 weeks. HOME CARE INSTRUCTIONS  Take medicines only as directed by your health care provider.  You may shower 24-48 hours after the procedure or as directed by your health care provider. Remove the bandage (dressing) and gently wash the site with plain soap and water. Pat the area dry with a clean towel. Do not rub the site, because this may cause bleeding.  Do not take baths, swim, or use a hot tub until your health care provider approves.  Check your insertion site every day for redness, swelling, or drainage.  Do not apply powder or lotion to the site.  Do not flex or bend the affected arm for 24 hours or as directed by your health care provider.  Do not push or pull heavy objects with the affected arm for 24 hours or as directed by your health care provider.  Do not lift over 10 lb (4.5 kg) for 5 days after your procedure or as directed by your health care provider.  Ask your health care provider when it is okay to:  Return to work or school.  Resume usual physical activities or sports.  Resume sexual activity.  Do not drive home if you are discharged the same day as the procedure. Have someone else drive you.  You may drive 24 hours after the procedure unless otherwise  instructed by your health care provider.  Do not operate machinery or power tools for 24 hours after the procedure.  If your procedure was done as an outpatient procedure, which means that you went home the same day as your procedure, a responsible adult should be with you for the first 24 hours after you arrive home.  Keep all follow-up visits as directed by your health care provider. This is important. SEEK MEDICAL CARE IF:  You have a fever.  You have chills.  You have increased bleeding from the radial site. Hold pressure on the site. SEEK IMMEDIATE MEDICAL CARE IF:  You have unusual pain at the radial site.  You have redness, warmth, or swelling at the radial site.  You have drainage (other than a small amount of blood on the dressing) from the radial site.  The radial site is bleeding, and the bleeding does not stop after 30 minutes of holding steady pressure on the site.  Your arm or hand becomes pale, cool, tingly, or numb.   This information is not intended to replace advice given to you by your health care provider. Make sure you discuss any questions you have with your health care provider.   Document Released: 01/15/2011 Document Revised: 01/03/2015 Document Reviewed: 07/01/2014 Elsevier Interactive Patient Education 2016 [employer]    Excuse from Work, [employer], or Physical Activity Andrea CedarsRobin Hughes  needs to be excused from: __X___ Work   beginning now and through the following date:  __06/29/17 and 06/25/2016 ____ Health Care Provider Name (printed): ________________________________________  Health Care Provider (signature): ___________________________________________ Date: ________________   This information is not intended to replace advice given to you by your health care provider. Make sure you discuss any questions you have with your health care provider.   Document Released: 06/08/2001 Document Revised: 01/03/2015 Document Reviewed:  07/15/2014 Elsevier Interactive Patient Education [employer]2016 Elsevier Inc.

## 2016-06-24 NOTE — H&P (View-Only) (Signed)
Cardiology Office Note  Date: 06/17/2016   ID: Andrea Hughes, DOB 30-Jan-1956, MRN [id number]  PCP: Selinda Flavin, MD  Primary Cardiologist: Nona Dell, MD   Chief Complaint  Patient presents with  . Coronary Artery Disease    History of Present Illness: Andrea Hughes is a 60 y.o. female just recently seen in May. At that time she was relatively stable from a cardiac perspective, but under a lot of stress since the passing of her mother. Over the last 2 weeks she has been having increasing episodes of chest tightness reminiscent of her angina. She called the office after one of these episodes with significant blood pressure elevation. She states that this has happened with activity such as walking down to a pond behind her house, she has had to stop more frequently. She has not tried nitroglycerin. Reports compliance with her medications. Very scared about her symptoms.  I reviewed her ECG today which shows sinus bradycardia with poor R-wave progression, rule out old anterior infarct pattern.  Last cardiac catheterization was in 2015 in the setting of NSTEMI and showed patent LAD stent site with moderately stenosed third diagonal branch. Event was felt to be potentially due to spasm or plaque shift. She did not require intervention at that time.  Past Medical History  Diagnosis Date  . Essential hypertension   . Type 2 diabetes mellitus (HCC)   . Depression   . Hyperlipidemia   . Morbid obesity (HCC)   . CAD (coronary artery disease)     a. 10/18/2014 NSTEMI, DES to mLAD 10/21/2014 b. 10/23/2014 NSTEMI, cath with patent stent, moderate stenosis in D3 enzyme elevation felt secondary to spasm with mild plaque shift in the diagonal  . NSTEMI (non-ST elevated myocardial infarction) [facility]) October 2015    Past Surgical History  Procedure Laterality Date  . Tubal ligation    . Back surgery  1988  . Cholecystectomy  2004  . Breast surgery      1984  . Tonsillectomy     60 years old  . Colonoscopy  09/07/2011    Procedure: COLONOSCOPY;  Surgeon: Dalia Heading;  Location: [facility];  Service: Gastroenterology;  Laterality: N/A;  . Vaginal hysterectomy    . Bladder suspension    . Left heart catheterization with coronary angiogram N/A 10/21/2014    Procedure: LEFT HEART CATHETERIZATION WITH CORONARY ANGIOGRAM;  Surgeon: Marykay Lex, MD;  Location[facility] CATH LAB;  Service: Cardiovascular;  Laterality: N/A;  . Left heart catheterization with coronary angiogram N/A 10/24/2014    Procedure: LEFT HEART CATHETERIZATION WITH CORONARY ANGIOGRAM;  Surgeon: Marykay Lex, MD;  Location: [facility] CATH LAB;  Service: Cardiovascular;  Laterality: N/A;    Current Outpatient Prescriptions  Medication Sig Dispense Refill  . acetaminophen (TYLENOL) 500 MG tablet Take 500 mg by mouth every 6 (six) hours as needed.    Marland Kitchen aspirin EC 81 MG EC tablet Take 1 tablet (81 mg total) by mouth daily.    Marland Kitchen atorvastatin (LIPITOR) 80 MG tablet TAKE ONE TABLET BY MOUTH ONCE DAILY AT  6PM 90 tablet 3  . clonazePAM (KLONOPIN) 1 MG tablet Take 1 mg by mouth as needed for anxiety.    . Diphenhydramine-APAP, sleep, (TYLENOL PM EXTRA STRENGTH PO) Take 1 tablet by mouth daily as needed (for pain).     Marland Kitchen escitalopram (LEXAPRO) 10 MG tablet Take 10 mg by mouth daily.    . isosorbide mononitrate (IMDUR) 60 MG 24 hr tablet  TAKE ONE TABLET BY MOUTH ONCE DAILY **DOSE  INCREASE** 90 tablet 0  . losartan-hydrochlorothiazide (HYZAAR) 100-12.5 MG per tablet Take 1 tablet by mouth daily.     . metFORMIN (GLUCOPHAGE XR) 500 MG 24 hr tablet Take 4 tablets (2,000 mg total) by mouth daily with breakfast. HOLD for 48 hours, restart on 10/27/2014. 120 tablet 11  . metoprolol (LOPRESSOR) 100 MG tablet TAKE ONE TABLET BY MOUTH TWICE DAILY 180 tablet 3  . nitroGLYCERIN (NITROSTAT) 0.4 MG SL tablet Place 1 tablet (0.4 mg total) under the tongue every 5 (five) minutes x 3 doses as needed for chest pain. If no relief  after 3rd dose, proceed to the ED for Hughes evaluation 25 tablet 12  . TRULICITY 0.75 MG/0.5ML SOPN Inject 0.75 mg into the skin every 7 (seven) days. On Sunday     No current facility-administered medications for this visit.   Allergies:  Contrast media; Sulfa drugs cross reactors; and Penicillins   Social History: The patient  reports that she has never smoked. She has never used smokeless tobacco. She reports that she does not drink alcohol or use illicit drugs.   Family History: The patient's family history includes Cancer in her father; Heart disease in her mother.   ROS:  Please see the history of present illness. Otherwise, complete review of systems is positive for fatigue, dyspnea on exertion.  All other systems are reviewed and negative.   Physical Exam: VS:  BP 160/80 mmHg  Pulse 58  Ht 5\' 5"  (1.651 m)  Wt 278 lb 6.4 oz (126.281 kg)  BMI 46.33 kg/m2  SpO2 97%, BMI Body mass index is 46.33 kg/(m^2).  Wt Readings from Last 3 Encounters:  06/17/16 278 lb 6.4 oz (126.281 kg)  05/12/16 276 lb (125.193 kg)  11/19/15 277 lb 6.4 oz (125.828 kg)    Obese woman, appears comfortable at rest. HEENT: Conjunctiva and lids normal, oropharynx clear. Neck: Supple, no elevated JVP or carotid bruits, no thyromegaly. Lungs: Clear to auscultation, nonlabored breathing at rest. Cardiac: Regular rate and rhythm, no S3 or significant systolic murmur, no pericardial rub. Abdomen: Soft, nontender, bowel sounds present. Extremities: No leg pitting edema, distal pulses 2+. Skin: Warm and dry. Musculoskeletal: No kyphosis. Neuropsychiatric: Alert and oriented 3, affect appropriate, tearful describing her symptoms.  ECG: I personally reviewed the tracing from 05/12/2016 showed sinus bradycardia with increased voltage and probable old anterior infarct pattern.  Recent Labwork:    Component Value Date/Time   CHOL 117 12/03/2014 0911   TRIG 76 12/03/2014 0911   HDL 36* 12/03/2014 0911   CHOLHDL  3.3 12/03/2014 0911   VLDL 15 12/03/2014 0911   LDLCALC 66 12/03/2014 0911  August 2016: Cholesterol 150, triglycerides 73, HDL 65, LDL 70, BUN 16, creatinine 0.6, potassium 4.0, AST 12, ALT 22, hemoglobin A1c 6.4  Other Studies Reviewed Today:  Echocardiogram 10/31/2014: Study Conclusions  - Left ventricle: The cavity size was normal. Wall thickness was increased in a pattern of mild LVH. Systolic function was normal. The estimated ejection fraction was in the range of 55% to 60%. Incidentally noted transverse false tendon near the LV apex. There is mild hypokinesis of the apicalanteroseptal myocardium. Doppler parameters are consistent with abnormal left ventricular relaxation (grade 1 diastolic dysfunction). - Aortic valve: Trileaflet. Nodular thickening of the noncoronary cusp. There was no significant regurgitation. - Mitral valve: Calcified annulus. There was trivial regurgitation. - Right atrium: Central venous pressure (est): 3 mm Hg. - Atrial septum: No defect or  patent foramen ovale was identified. - Tricuspid valve: There was trivial regurgitation. - Pulmonary arteries: Systolic pressure could not be accurately estimated. - Pericardium, extracardiac: There was no pericardial effusion.  Impressions:  - Mild LVH with LVEF 55-60%, mild hypokinesis of the apical anteroseptal wall, grade 1 diastolic dysfunction. Mild nodular thickening of the noncoronary aortic cusp. No aortic regurgitation. Trivial tricuspid regurgitation, unable to assess PASP.  Assessment and Plan:  1. Patient reporting accelerating angina symptoms on stable medical regimen. She has a history of DES to the LAD in October 2015, moderate third diagonal disease subsequently noted in the setting of NSTEMI. ECG does not show acute ST segment change today. We have discussed options for further evaluation, and plan after discussion is to arrange a diagnostic cardiac catheterization to  most clearly reevaluate coronary anatomy. We have discussed the risks and benefits, and she is in agreement to proceed.  2. Hyperlipidemia, on Lipitor. Last LDL 70.  3. Essential hypertension, blood pressure trend has been up recently in association with her symptoms.  4. Type 2 diabetes mellitus, on Glucophage per Dr. Dimas AguasHoward.  Current medicines were reviewed with the patient today.   Orders Placed This Encounter  Procedures  . EKG 12-Lead    Disposition: Follow-up with me after cardiac catheterization.  Signed, Jonelle SidleSamuel G. Siham Bucaro, MD, [facility] 06/17/2016 3:02 PM     [facility] [address], [place], [place] [address] Phone: [phone]; Fax: [phone]

## 2016-06-24 NOTE — Interval H&P Note (Signed)
History and Physical Interval Note:  06/24/2016 12:29 PM  Andrea Hughes  has presented today for surgery, with the diagnosis of accelerating angina/unstable angina  The various methods of treatment have been discussed with the patient and family. After consideration of risks, benefits and other options for treatment, the patient has consented to  Procedure(s): Left Heart Cath and Coronary Angiography (N/A) With Possible Percutaneous Coronary Intervention  as a surgical intervention .  The patient's history has been reviewed, patient examined, no change in status, stable for surgery.  I have reviewed the patient's chart and labs.  Questions were answered to the patient's satisfaction.    Cath Lab Visit (complete for each Cath Lab visit)  Clinical Evaluation Leading to the Procedure:   ACS: No.  Non-ACS:    Anginal Classification: CCS III  Anti-ischemic medical therapy: Maximal Therapy (2 or more classes of medications)  Non-Invasive Test Results: No non-invasive testing performed  Prior CABG: No previous CABG   Ischemic Symptoms? CCS III (Marked limitation of ordinary activity) Anti-ischemic Medical Therapy? Maximal Medical Therapy (2 or more classes of medications) Non-invasive Test Results? No non-invasive testing performed Prior CABG? No Previous CABG   Patient Information:   1-2V CAD, no prox LAD  A (7)  Indication: 20; Score: 7   Patient Information:   1-2V-CAD with DS 50-60% With No FFR, No IVUS  I (3)  Indication: 21; Score: 3   Patient Information:   1-2V-CAD with DS 50-60% With FFR  A (7)  Indication: 22; Score: 7   Patient Information:   1-2V-CAD with DS 50-60% With FFR>0.8, IVUS not significant  I (2)  Indication: 23; Score: 2   Patient Information:   3V-CAD without LMCA With Abnormal LV systolic function  A (9)  Indication: 48; Score: 9   Patient Information:   LMCA-CAD  A (9)  Indication: 49; Score: 9   Patient Information:    2V-CAD with prox LAD PCI  A (7)  Indication: 62; Score: 7   Patient Information:   2V-CAD with prox LAD CABG  A (8)  Indication: 62; Score: 8   Patient Information:   3V-CAD without LMCA With Low CAD burden(i.e., 3 focal stenoses, low SYNTAX score) PCI  A (7)  Indication: 63; Score: 7   Patient Information:   3V-CAD without LMCA With Low CAD burden(i.e., 3 focal stenoses, low SYNTAX score) CABG  A (9)  Indication: 63; Score: 9   Patient Information:   3V-CAD without LMCA E06c - Intermediate-high CAD burden (i.e., multiple diffuse lesions, presence of CTO, or high SYNTAX score) PCI  U (4)  Indication: 64; Score: 4   Patient Information:   3V-CAD without LMCA E06c - Intermediate-high CAD burden (i.e., multiple diffuse lesions, presence of CTO, or high SYNTAX score) CABG  A (9)  Indication: 64; Score: 9   Patient Information:   LMCA-CAD With Isolated LMCA stenosis  PCI  U (6)  Indication: 65; Score: 6   Patient Information:   LMCA-CAD With Isolated LMCA stenosis  CABG  A (9)  Indication: 65; Score: 9   Patient Information:   LMCA-CAD Additional CAD, low CAD burden (i.e., 1- to 2-vessel additional involvement, low SYNTAX score) PCI  U (5)  Indication: 66; Score: 5   Patient Information:   LMCA-CAD Additional CAD, low CAD burden (i.e., 1- to 2-vessel additional involvement, low SYNTAX score) CABG  A (9)  Indication: 66; Score: 9   Patient Information:   LMCA-CAD Additional CAD, intermediate-high CAD burden (  i.e., 3-vessel involvement, presence of CTO, or high SYNTAX score) PCI  I (3)  Indication: 67; Score: 3   Patient Information:   LMCA-CAD Additional CAD, intermediate-high CAD burden (i.e., 3-vessel involvement, presence of CTO, or high SYNTAX score) CABG  A (9)  Indication: 67; Score: 9      Bryan Lemmaavid Kolbi Altadonna

## 2016-06-25 ENCOUNTER — Encounter (HOSPITAL_COMMUNITY): Payer: Self-pay | Admitting: Cardiology

## 2016-06-25 ENCOUNTER — Telehealth: Payer: Self-pay | Admitting: Cardiology

## 2016-06-25 NOTE — Telephone Encounter (Signed)
Patient called stating that she woke up today feeling hot, face is red and itching.

## 2016-06-25 NOTE — Telephone Encounter (Signed)
Pt denies CP/dizziness/SOB says only warm face occasional itching. Pt says she took another benadryl and this helped. Pt will report to urgent care or ED if symptoms do not improve tomorrow or develops symptoms

## 2016-07-19 ENCOUNTER — Other Ambulatory Visit: Payer: Self-pay | Admitting: Cardiology

## 2016-07-19 NOTE — Progress Notes (Signed)
Cardiology Office Note  Date: 07/21/2016   ID: Andrea Hughes, DOB 1956/04/25, MRN [id number]  PCP: Andrea Flavin, MD  Primary Cardiologist: Andrea Dell, MD   Chief Complaint  Patient presents with  . Coronary Artery Disease    History of Present Illness: Andrea Hughes is a 60 y.o. female seen recently in June and referred for cardiac catheterization with progressive angina symptoms. Procedure was performed by Dr. Herbie Hughes on June 29 and revealed widely patent LAD stent site. She did have jailing of the third diagonal with moderate stenosis that appeared similar to prior angiographic studies. Revascularization was not clearly indicated, and medical therapy was continued.  She presents today for follow-up. States that she is very reassured overall by the findings of the recent cardiac catheterization. She reports improved chest pain, wonders if emotional stress has been a big part of her symptoms. She reports compliance with her medications and has had no increasing nitroglycerin requirement. We discussed continuing medical therapy and observation for now. She is getting ready to go [place] the [place] for a vacation with her husband for their 40th wedding anniversary.  Past Medical History:  Diagnosis Date  . CAD (coronary artery disease)    a. 10/18/2014 NSTEMI, DES to mLAD 10/21/2014 b. 10/23/2014 NSTEMI, cath with patent stent, moderate stenosis in D3 enzyme elevation felt secondary to spasm with mild plaque shift in the diagonal  . Depression   . Essential hypertension   . Hyperlipidemia   . Morbid obesity (HCC)   . NSTEMI (non-ST elevated myocardial infarction) [facility]) October 2015  . Type 2 diabetes mellitus (HCC)     Current Outpatient Prescriptions  Medication Sig Dispense Refill  . acetaminophen (TYLENOL) 500 MG tablet Take 500 mg by mouth every 6 (six) hours as needed.    Marland Kitchen aspirin EC 81 MG EC tablet Take 1 tablet (81 mg total) by mouth daily.    Marland Kitchen atorvastatin  (LIPITOR) 80 MG tablet TAKE ONE TABLET BY MOUTH ONCE DAILY AT  6PM 90 tablet 3  . clonazePAM (KLONOPIN) 1 MG tablet Take 1 mg by mouth as needed for anxiety.    . diphenhydramine-acetaminophen (TYLENOL PM) 25-500 MG TABS tablet Take 1 tablet by mouth at bedtime as needed.    Marland Kitchen escitalopram (LEXAPRO) 10 MG tablet Take 10 mg by mouth daily.    . isosorbide mononitrate (IMDUR) 60 MG 24 hr tablet TAKE ONE TABLET BY MOUTH ONCE DAILY **DOSE INCREASE** 90 tablet 1  . losartan-hydrochlorothiazide (HYZAAR) 100-12.5 MG per tablet Take 1 tablet by mouth daily.     . metFORMIN (GLUCOPHAGE XR) 500 MG 24 hr tablet Take 4 tablets (2,000 mg total) by mouth daily with breakfast. HOLD for 48 hours, restart on 10/27/2014. (Patient taking differently: Take 1,000 mg by mouth 2 (two) times daily. HOLD for 48 hours, restart on 10/27/2014.) 120 tablet 11  . metoprolol (LOPRESSOR) 100 MG tablet TAKE ONE TABLET BY MOUTH TWICE DAILY 180 tablet 3  . naproxen sodium (ALEVE) 220 MG tablet Take 220-440 mg by mouth 2 (two) times daily as needed.    . nitroGLYCERIN (NITROSTAT) 0.4 MG SL tablet Place 1 tablet (0.4 mg total) under the tongue every 5 (five) minutes x 3 doses as needed for chest pain. If no relief after 3rd dose, proceed to the ED for an evaluation 25 tablet 12  . TRULICITY 0.75 MG/0.5ML SOPN Inject 0.75 mg into the skin every 7 (seven) days. On Sunday    . diphenhydrAMINE (BENADRYL) 25 MG  tablet Take 1 tablet (25 mg total) by mouth as directed. Take 25 mg at 6:00 pm on 06/23/16 and 25 mg at 6:00 am on 06/24/16 2 tablet 0  . ranitidine (ZANTAC) 150 MG tablet Take 1 tablet (150 mg total) by mouth as directed. Take 150 mg at 6:00 pm on 06/23/16; 150 mg at midnight; & 150 mg at 6:00 am on 06/24/16 3 tablet 0   No current facility-administered medications for this visit.    Allergies:  Contrast media [iodinated diagnostic agents]; Sulfa drugs cross reactors; and Penicillins   Social History: The patient  reports that she  has never smoked. She has never used smokeless tobacco. She reports that she does not drink alcohol or use drugs.   ROS:  Please see the history of present illness. Otherwise, complete review of systems is positive for none.  All other systems are reviewed and negative.   Physical Exam: VS:  BP (!) 142/70   Pulse 62   Ht 5\' 5"  (1.651 m)   Wt 272 lb (123.4 kg)   SpO2 97%   BMI 45.26 kg/m , BMI Body mass index is 45.26 kg/m.  Wt Readings from Last 3 Encounters:  07/21/16 272 lb (123.4 kg)  06/24/16 277 lb (125.6 kg)  06/17/16 278 lb 6.4 oz (126.3 kg)    Obese woman, appears comfortable at rest. HEENT: Conjunctiva and lids normal, oropharynx clear. Neck: Supple, no elevated JVP or carotid bruits, no thyromegaly. Lungs: Clear to auscultation, nonlabored breathing at rest. Cardiac: Regular rate and rhythm, no S3 or significant systolic murmur, no pericardial rub. Abdomen: Soft, nontender, bowel sounds present. Extremities: No leg pitting edema, distal pulses 2+.  ECG: I personally reviewed the tracing from 06/17/2016 which showed sinus bradycardia with poor R-wave progression, rule out old anterior infarct pattern.  Recent Labwork: 06/24/2016: BUN 13; Creatinine, Ser 0.66; Hemoglobin 12.4; Platelets 196; Potassium 3.8; Sodium 140     Component Value Date/Time   CHOL 117 12/03/2014 0911   TRIG 76 12/03/2014 0911   HDL 36 (L) 12/03/2014 0911   CHOLHDL 3.3 12/03/2014 0911   VLDL 15 12/03/2014 0911   LDLCALC 66 12/03/2014 0911    Other Studies Reviewed Today:  Cardiac catheterization 06/24/2016:  Suezanne Jacquet 3rd Diag to 3rd Diag lesion - jailed by stent in LAD, 55% stenosed. Angiographic similar to last catheterization  Widely patent mid LAD stent  Normal EF with normal EDP   Angiographically no significant difference in findings from last catheterization. L diagonal appears to be very similar to last catheterization. There could be some spasm of the slight, but this is not a good PCI  target as it was not last time. The ostial nature of the lesion and size discrepancy between the diagonal and the LAD makes it very difficult PCI target. Would only be able to do balloon angioplasty which would be suboptimal.  Assessment and Plan:  1. CAD status post DES to the mid LAD in 2015, angiographically stable by recent cardiac catheterization. Third diagonal lesion remained stable as well, not entirely clear how symptom provoking this is however. We are planning to continue medical therapy and observation for now.  2. Essential hypertension. No changes made to current regimen. Weight loss encouraged.  Current medicines were reviewed with the patient today.  Disposition: Follow-up with me in 6 months.  Signed, Jonelle Sidle, MD, [facility] 07/21/2016 8:36 AM    [facility] at [facility] [address] [place], [place], [place] [address] Phone: [phone])  [phone]; Fax: [phone]

## 2016-07-21 ENCOUNTER — Ambulatory Visit (INDEPENDENT_AMBULATORY_CARE_PROVIDER_SITE_OTHER): Payer: 59 | Admitting: Cardiology

## 2016-07-21 ENCOUNTER — Encounter: Payer: Self-pay | Admitting: Cardiology

## 2016-07-21 VITALS — BP 142/70 | HR 62 | Ht 65.0 in | Wt 272.0 lb

## 2016-07-21 DIAGNOSIS — I25119 Atherosclerotic heart disease of native coronary artery with unspecified angina pectoris: Secondary | ICD-10-CM | POA: Diagnosis not present

## 2016-07-21 DIAGNOSIS — I10 Essential (primary) hypertension: Secondary | ICD-10-CM

## 2016-07-21 NOTE — Patient Instructions (Signed)

## 2016-12-23 ENCOUNTER — Other Ambulatory Visit: Payer: Self-pay | Admitting: *Deleted

## 2016-12-23 ENCOUNTER — Other Ambulatory Visit: Payer: Self-pay | Admitting: Cardiology

## 2016-12-23 MED ORDER — ATORVASTATIN CALCIUM 80 MG PO TABS: ORAL_TABLET | ORAL | 0 refills | Status: DC

## 2017-03-06 ENCOUNTER — Other Ambulatory Visit: Payer: Self-pay | Admitting: Cardiology

## 2017-04-12 ENCOUNTER — Other Ambulatory Visit: Payer: Self-pay | Admitting: Cardiology

## 2017-05-12 ENCOUNTER — Other Ambulatory Visit: Payer: Self-pay | Admitting: Cardiology

## 2017-06-07 NOTE — Progress Notes (Signed)
Cardiology Office Note  Date: 06/08/2017   ID: Andrea CurlRobin W Corum, DOB 15-Sep-1956, MRN [id number]  PCP: Selinda FlavinHoward, Kevin, MD  Primary Cardiologist: Nona DellSamuel Sanaiyah Kirchhoff, MD   Chief Complaint  Patient presents with  . Coronary Artery Disease    History of Present Illness: Andrea Hughes is a 61 y.o. female last seen in July 2017. She presents for a routine follow-up visit. Reports no angina symptoms on medical therapy. Still working full time. She plans to take a trip with her husband soon for their 41st wedding anniversary.  I personally reviewed her ECG today which shows sinus rhythm with leftward axis and increased voltage, also poor R-wave progression anteriorly rule out old inferior infarct pattern.  We went over her medications. She did run out of Lopressor recently, heart rate more elevated over baseline. Otherwise continues on aspirin, Lipitor, Imdur, Hyzaar, and has as needed nitroglycerin available.  Past Medical History:  Diagnosis Date  . CAD (coronary artery disease)    a. 10/18/2014 NSTEMI, DES to mLAD 10/21/2014 b. 10/23/2014 NSTEMI, cath with patent stent, moderate stenosis in D3 enzyme elevation felt secondary to spasm with mild plaque shift in the diagonal  . Depression   . Essential hypertension   . Hyperlipidemia   . Morbid obesity (HCC)   . NSTEMI (non-ST elevated myocardial infarction) [facility](HCC) October 2015  . Type 2 diabetes mellitus (HCC)     Past Surgical History:  Procedure Laterality Date  . BACK SURGERY  1988  . BLADDER SUSPENSION    . BREAST SURGERY     1984  . CARDIAC CATHETERIZATION N/A 06/24/2016   Procedure: Left Heart Cath and Coronary Angiography;  Surgeon: Marykay Lexavid W Harding, MD;  [facility];  Service: Cardiovascular;  Laterality: N/A;  . CHOLECYSTECTOMY  2004  . COLONOSCOPY  09/07/2011   Procedure: COLONOSCOPY;  Surgeon: Dalia HeadingMark A Jenkins;  Location: [facility] ENDO SUITE;  Service: Gastroenterology;  Laterality: N/A;  . LEFT HEART  CATHETERIZATION WITH CORONARY ANGIOGRAM N/A 10/21/2014   Procedure: LEFT HEART CATHETERIZATION WITH CORONARY ANGIOGRAM;  Surgeon: Marykay Lexavid W Harding, MD;  [facility];  Service: Cardiovascular;  Laterality: N/A;  . LEFT HEART CATHETERIZATION WITH CORONARY ANGIOGRAM N/A 10/24/2014   Procedure: LEFT HEART CATHETERIZATION WITH CORONARY ANGIOGRAM;  Surgeon: Marykay Lexavid W Harding, MD;  [facility];  Service: Cardiovascular;  Laterality: N/A;  . TONSILLECTOMY     61 years old  . TUBAL LIGATION    . VAGINAL HYSTERECTOMY      Current Outpatient Prescriptions  Medication Sig Dispense Refill  . aspirin 325 MG tablet Take 325 mg by mouth daily.    . diphenhydrAMINE (BENADRYL) 25 MG tablet Take 25 mg by mouth as needed for allergies or sleep.    . Dulaglutide (TRULICITY) 1.5 MG/0.5ML SOPN Inject 1.5 mg into the skin once a week.    . escitalopram (LEXAPRO) 10 MG tablet Take 10 mg by mouth daily.    . isosorbide mononitrate (IMDUR) 60 MG 24 hr tablet TAKE ONE TABLET BY MOUTH ONCE DAILY **DOSE  INCREASE** 30 tablet 0  . losartan-hydrochlorothiazide (HYZAAR) 100-12.5 MG per tablet Take 1 tablet by mouth daily.     . metFORMIN (GLUCOPHAGE XR) 500 MG 24 hr tablet Take 4 tablets (2,000 mg total) by mouth daily with breakfast. HOLD for 48 hours, restart on 10/27/2014. (Patient taking differently: Take 1,000 mg by mouth 2 (two) times daily. HOLD for 48 hours, restart on 10/27/2014.) 120 tablet 11  . metoprolol tartrate (  LOPRESSOR) 100 MG tablet TAKE 1 TABLET BY MOUTH TWICE DAILY 180 tablet 1  . naproxen sodium (ALEVE) 220 MG tablet Take 220-440 mg by mouth 2 (two) times daily as needed.    . nitroGLYCERIN (NITROSTAT) 0.4 MG SL tablet Place 1 tablet (0.4 mg total) under the tongue every 5 (five) minutes x 3 doses as needed for chest pain. If no relief after 3rd dose, proceed to the ED for an evaluation 25 tablet 12  . atorvastatin (LIPITOR) 80 MG tablet TAKE ONE TABLET BY MOUTH ONCE DAILY AT 6PM 90  tablet 0  . clonazePAM (KLONOPIN) 1 MG tablet Take 1 mg by mouth as needed for anxiety.     No current facility-administered medications for this visit.    Allergies:  Contrast media [iodinated diagnostic agents]; Sulfa drugs cross reactors; and Penicillins   Social History: The patient  reports that she has never smoked. She has never used smokeless tobacco. She reports that she does not drink alcohol or use drugs.   ROS:  Please see the history of present illness. Otherwise, complete review of systems is positive for none.  All other systems are reviewed and negative.   Physical Exam: VS:  BP 120/84   Pulse 81   Ht 5\' 5"  (1.651 m)   Wt 265 lb 12.8 oz (120.6 kg)   SpO2 97%   BMI 44.23 kg/m , BMI Body mass index is 44.23 kg/m.  Wt Readings from Last 3 Encounters:  06/08/17 265 lb 12.8 oz (120.6 kg)  07/21/16 272 lb (123.4 kg)  06/24/16 277 lb (125.6 kg)    Obese woman, appears comfortable at rest. HEENT: Conjunctiva and lids normal, oropharynx clear. Neck: Supple, no elevated JVP or carotid bruits, no thyromegaly. Lungs: Clear to auscultation, nonlabored breathing at rest. Cardiac: Regular rate and rhythm, no S3 or significant systolic murmur, no pericardial rub. Abdomen: Soft, nontender, bowel sounds present. Extremities: No leg pitting edema, distal pulses 2+.  ECG: I personally reviewed the tracing from 06/17/2016 which showed sinus bradycardia with poor R-wave progression.  Recent Labwork: 06/24/2016: BUN 13; Creatinine, Ser 0.66; Hemoglobin 12.4; Platelets 196; Potassium 3.8; Sodium 140   Other Studies Reviewed Today:  Cardiac catheterization 06/24/2016:  Ost 3rd Diag to 3rd Diag lesion - jailed by stent in LAD, 55% stenosed. Angiographic similar to last catheterization  Widely patent mid LAD stent  Normal EF with normal EDP   Angiographically no significant difference in findings from last catheterization. L diagonal appears to be very similar to last  catheterization. There could be some spasm of the slight, but this is not a good PCI target as it was not last time. The ostial nature of the lesion and size discrepancy between the diagonal and the LAD makes it very difficult PCI target. Would only be able to do balloon angioplasty which would be suboptimal.  Assessment and Plan:  1. CAD with history of DES to the LAD in 2015, stable by cardiac catheterization last year. She reports no significant angina on medical therapy. Refill provided for Lopressor, otherwise continue without changes.  2. Essential hypertension, blood pressure is well controlled today.  3. Hyperlipidemia, on Lipitor. She continues to follow with Dr. Dimas Aguas.  4. Morbid obesity. We have discussed weight loss.  Current medicines were reviewed with the patient today.  Disposition: Follow-up in 6 months.  Signed, Jonelle Sidle, MD, [facility] 06/08/2017 4:24 PM    [facility] at [facility] [address] [place], [place], [place] [address]  Phone: [phone]; Fax: [phone]

## 2017-06-08 ENCOUNTER — Encounter: Payer: Self-pay | Admitting: Cardiology

## 2017-06-08 ENCOUNTER — Ambulatory Visit (INDEPENDENT_AMBULATORY_CARE_PROVIDER_SITE_OTHER): Payer: 59 | Admitting: Cardiology

## 2017-06-08 VITALS — BP 120/84 | HR 81 | Ht 65.0 in | Wt 265.8 lb

## 2017-06-08 DIAGNOSIS — E782 Mixed hyperlipidemia: Secondary | ICD-10-CM | POA: Diagnosis not present

## 2017-06-08 DIAGNOSIS — I25119 Atherosclerotic heart disease of native coronary artery with unspecified angina pectoris: Secondary | ICD-10-CM | POA: Diagnosis not present

## 2017-06-08 DIAGNOSIS — I10 Essential (primary) hypertension: Secondary | ICD-10-CM

## 2017-06-08 MED ORDER — METOPROLOL TARTRATE 100 MG PO TABS: ORAL_TABLET | ORAL | 1 refills | Status: DC

## 2017-06-08 NOTE — Patient Instructions (Signed)

## 2017-06-09 NOTE — Addendum Note (Signed)
Addended by: Lesle ChrisHILL, Canary Fister G on: 06/09/2017 08:38 AM   Modules accepted: Orders

## 2017-07-18 ENCOUNTER — Other Ambulatory Visit: Payer: Self-pay | Admitting: Cardiology

## 2017-12-06 ENCOUNTER — Ambulatory Visit: Payer: 59 | Admitting: Cardiology

## 2017-12-28 ENCOUNTER — Ambulatory Visit: Payer: 59 | Admitting: Cardiology

## 2017-12-28 ENCOUNTER — Encounter: Payer: Self-pay | Admitting: Cardiology

## 2017-12-28 ENCOUNTER — Other Ambulatory Visit: Payer: Self-pay

## 2017-12-28 VITALS — BP 158/84 | HR 60 | Ht 65.0 in | Wt 282.0 lb

## 2017-12-28 DIAGNOSIS — E782 Mixed hyperlipidemia: Secondary | ICD-10-CM | POA: Diagnosis not present

## 2017-12-28 DIAGNOSIS — I10 Essential (primary) hypertension: Secondary | ICD-10-CM

## 2017-12-28 DIAGNOSIS — E1159 Type 2 diabetes mellitus with other circulatory complications: Secondary | ICD-10-CM

## 2017-12-28 DIAGNOSIS — I25119 Atherosclerotic heart disease of native coronary artery with unspecified angina pectoris: Secondary | ICD-10-CM

## 2017-12-28 NOTE — Patient Instructions (Signed)

## 2017-12-28 NOTE — Progress Notes (Signed)
Cardiology Office Note  Date: 12/28/2017   ID: Andrea CurlRobin W Pensyl, DOB 1956-10-29, MRN [id number]  PCP: Selinda FlavinHoward, Kevin, MD  Primary Cardiologist: Nona DellSamuel Barron Vanloan, MD   Chief Complaint  Patient presents with  . Coronary Artery Disease    History of Present Illness: [facility] is a 62 y.o. female last seen in June. She is here for a routine follow-up visit. Reports no angina symptoms or nitroglycerin use. She has NYHA class II dyspnea, no changes. No palpitations or syncope.  I reviewed her medications. She states that she sometimes forgets to take her morning medicines. Blood pressure is mildly elevated today. She did have lab work with Dr. Dimas AguasHoward in the interim, we are requesting the results. I talked with her about blood sugar control and also walking plan for exercise.  Cardiac catheterization results from June 2017 are outlined below. Mid LAD stent was widely patent with chronic jailing of the third diagonal branch.  Past Medical History:  Diagnosis Date  . CAD (coronary artery disease)    a. 10/18/2014 NSTEMI, DES to mLAD 10/21/2014 b. 10/23/2014 NSTEMI, cath with patent stent, moderate stenosis in D3 enzyme elevation felt secondary to spasm with mild plaque shift in the diagonal  . Depression   . Essential hypertension   . Hyperlipidemia   . Morbid obesity (HCC)   . NSTEMI (non-ST elevated myocardial infarction) [facility](HCC) October 2015  . Type 2 diabetes mellitus (HCC)     Past Surgical History:  Procedure Laterality Date  . BACK SURGERY  1988  . BLADDER SUSPENSION    . BREAST SURGERY     1984  . CARDIAC CATHETERIZATION N/A 06/24/2016   Procedure: Left Heart Cath and Coronary Angiography;  Surgeon: Marykay Lexavid W Harding, MD;  [facility];  Service: Cardiovascular;  Laterality: N/A;  . CHOLECYSTECTOMY  2004  . COLONOSCOPY  09/07/2011   Procedure: COLONOSCOPY;  Surgeon: Dalia HeadingMark A Jenkins;  [facility];  Service: Gastroenterology;  Laterality: N/A;  .  LEFT HEART CATHETERIZATION WITH CORONARY ANGIOGRAM N/A 10/21/2014   Procedure: LEFT HEART CATHETERIZATION WITH CORONARY ANGIOGRAM;  Surgeon: Marykay Lexavid W Harding, MD;  [facility];  Service: Cardiovascular;  Laterality: N/A;  . LEFT HEART CATHETERIZATION WITH CORONARY ANGIOGRAM N/A 10/24/2014   Procedure: LEFT HEART CATHETERIZATION WITH CORONARY ANGIOGRAM;  Surgeon: Marykay Lexavid W Harding, MD;  [facility];  Service: Cardiovascular;  Laterality: N/A;  . TONSILLECTOMY     62 years old  . TUBAL LIGATION    . VAGINAL HYSTERECTOMY      Current Outpatient Medications  Medication Sig Dispense Refill  . aspirin 325 MG tablet Take 325 mg by mouth daily.    Marland Kitchen. atorvastatin (LIPITOR) 80 MG tablet TAKE 1 TABLET BY MOUTH ONCE DAILY AT 6PM 90 tablet 3  . clonazePAM (KLONOPIN) 1 MG tablet Take 1 mg by mouth as needed for anxiety.    . diphenhydrAMINE (BENADRYL) 25 MG tablet Take 25 mg by mouth as needed for allergies or sleep.    . Dulaglutide (TRULICITY) 1.5 MG/0.5ML SOPN Inject 1.5 mg into the skin once a week.    . escitalopram (LEXAPRO) 10 MG tablet Take 10 mg by mouth daily.    . isosorbide mononitrate (IMDUR) 60 MG 24 hr tablet TAKE 1 TABLET BY MOUTH ONCE DAILY *PATIENT NEEDS OFFICE VISIT* 90 tablet 3  . losartan-hydrochlorothiazide (HYZAAR) 100-12.5 MG per tablet Take 1 tablet by mouth daily.     . metFORMIN (GLUCOPHAGE XR) 500 MG  24 hr tablet Take 4 tablets (2,000 mg total) by mouth daily with breakfast. HOLD for 48 hours, restart on 10/27/2014. (Patient taking differently: Take 1,000 mg by mouth 2 (two) times daily. HOLD for 48 hours, restart on 10/27/2014.) 120 tablet 11  . metoprolol tartrate (LOPRESSOR) 100 MG tablet TAKE 1 TABLET BY MOUTH TWICE DAILY 180 tablet 1  . naproxen sodium (ALEVE) 220 MG tablet Take 220-440 mg by mouth 2 (two) times daily as needed.    . nitroGLYCERIN (NITROSTAT) 0.4 MG SL tablet Place 1 tablet (0.4 mg total) under the tongue every 5 (five) minutes x 3 doses as  needed for chest pain. If no relief after 3rd dose, proceed to the ED for an evaluation 25 tablet 12   No current facility-administered medications for this visit.    Allergies:  Contrast media [iodinated diagnostic agents]; Sulfa drugs cross reactors; and Penicillins   Social History: The patient  reports that  has never smoked. she has never used smokeless tobacco. She reports that she does not drink alcohol or use drugs.   ROS:  Please see the history of present illness. Otherwise, complete review of systems is positive for none.  All other systems are reviewed and negative.   Physical Exam: VS:  BP (!) 158/84   Pulse 60   Ht 5\' 5"  (1.651 m)   Wt 282 lb (127.9 kg)   BMI 46.93 kg/m , BMI Body mass index is 46.93 kg/m.  Wt Readings from Last 3 Encounters:  12/28/17 282 lb (127.9 kg)  06/08/17 265 lb 12.8 oz (120.6 kg)  07/21/16 272 lb (123.4 kg)    General: Obese woman, appears comfortable at rest. HEENT: Conjunctiva and lids normal, oropharynx clear. Neck: Supple, no elevated JVP or carotid bruits, no thyromegaly. Lungs: Clear to auscultation, nonlabored breathing at rest. Cardiac: Regular rate and rhythm, no S3 or significant systolic murmur, no pericardial rub. Abdomen: Soft, nontender, bowel sounds present. Extremities: No pitting edema, distal pulses 2+. Skin: Warm and dry. Musculoskeletal: No kyphosis. Neuropsychiatric: Alert and oriented x3, affect grossly appropriate.  ECG: I personally reviewed the tracing from 06/08/2017 which showed sinus rhythm with leftward axis and LVH.  Recent Labwork:  06/24/2016: BUN 13; Creatinine, Ser 0.66; Hemoglobin 12.4; Platelets 196; Potassium 3.8; Sodium 140   Other Studies Reviewed Today:  Cardiac catheterization 06/24/2016:  Ost 3rd Diag to 3rd Diag lesion - jailed by stent in LAD, 55% stenosed. Angiographic similar to last catheterization  Widely patent mid LAD stent  Normal EF with normal EDP  Angiographically no  significant difference in findings from last catheterization. L diagonal appears to be very similar to last catheterization. There could be some spasm of the slight, but this is not a good PCI target as it was not last time. The ostial nature of the lesion and size discrepancy between the diagonal and the LAD makes it very difficult PCI target. Would only be able to do balloon angioplasty which would be suboptimal.  Assessment and Plan:  1. CAD with previous DES intervention to the LAD in 2015. Follow-up angiography in 2017 showed patent stent site with chronically jailed diagonal branch that is being managed medically. She reports no active angina symptoms on current medical regimen.  2. Essential hypertension, blood pressure elevated today. She does state that she sometimes misses her morning medications. We discussed compliance.  3. Mixed hyperlipidemia on Lipitor. Requesting lab work from Dr. Dimas Aguas.  4. Type 2 diabetes mellitus, continued on medical therapy with follow-up per  Dr. Dimas Aguas. She recalls recent hemoglobin A1c at 6.2%.  Current medicines were reviewed with the patient today.  Disposition: Follow-up in 6 months.  Signed, Jonelle Sidle, MD, [facility] 12/28/2017 8:45 AM    [facility] at [facility] [address] [place], [place], [place] [address] Phone: [phone]; Fax: [phone]

## 2018-01-20 ENCOUNTER — Ambulatory Visit: Payer: 59 | Admitting: Cardiology

## 2018-02-06 ENCOUNTER — Other Ambulatory Visit: Payer: Self-pay | Admitting: Cardiology

## 2018-06-29 NOTE — Progress Notes (Deleted)
Cardiology Office Note  Date: 06/29/2018   ID: Andrea CurlRobin W Nabi, DOB Jun 02, 1956, MRN [id number]  PCP: Selinda FlavinHoward, Kevin, MD  Primary Cardiologist: Nona DellSamuel Akeya Ryther, MD   No chief complaint on file.   History of Present Illness: Andrea Hughes is a 62 y.o. female last seen in January.  Past Medical History:  Diagnosis Date  . CAD (coronary artery disease)    a. 10/18/2014 NSTEMI, DES to mLAD 10/21/2014 b. 10/23/2014 NSTEMI, cath with patent stent, moderate stenosis in D3 enzyme elevation felt secondary to spasm with mild plaque shift in the diagonal  . Depression   . Essential hypertension   . Hyperlipidemia   . Morbid obesity (HCC)   . NSTEMI (non-ST elevated myocardial infarction) [facility](HCC) October 2015  . Type 2 diabetes mellitus (HCC)     Past Surgical History:  Procedure Laterality Date  . BACK SURGERY  1988  . BLADDER SUSPENSION    . BREAST SURGERY     1984  . CARDIAC CATHETERIZATION N/A 06/24/2016   Procedure: Left Heart Cath and Coronary Angiography;  Surgeon: Marykay Lexavid W Harding, MD;  [facility];  Service: Cardiovascular;  Laterality: N/A;  . CHOLECYSTECTOMY  2004  . COLONOSCOPY  09/07/2011   Procedure: COLONOSCOPY;  Surgeon: Dalia HeadingMark A Jenkins;  [facility];  Service: Gastroenterology;  Laterality: N/A;  . LEFT HEART CATHETERIZATION WITH CORONARY ANGIOGRAM N/A 10/21/2014   Procedure: LEFT HEART CATHETERIZATION WITH CORONARY ANGIOGRAM;  Surgeon: Marykay Lexavid W Harding, MD;  [facility];  Service: Cardiovascular;  Laterality: N/A;  . LEFT HEART CATHETERIZATION WITH CORONARY ANGIOGRAM N/A 10/24/2014   Procedure: LEFT HEART CATHETERIZATION WITH CORONARY ANGIOGRAM;  Surgeon: Marykay Lexavid W Harding, MD;  [facility];  Service: Cardiovascular;  Laterality: N/A;  . TONSILLECTOMY     62 years old  . TUBAL LIGATION    . VAGINAL HYSTERECTOMY      Current Outpatient Medications  Medication Sig Dispense Refill  . aspirin 325 MG tablet Take 325 mg by  mouth daily.    Marland Kitchen. atorvastatin (LIPITOR) 80 MG tablet TAKE 1 TABLET BY MOUTH ONCE DAILY AT 6PM 90 tablet 3  . clonazePAM (KLONOPIN) 1 MG tablet Take 1 mg by mouth as needed for anxiety.    . diphenhydrAMINE (BENADRYL) 25 MG tablet Take 25 mg by mouth as needed for allergies or sleep.    . Dulaglutide (TRULICITY) 1.5 MG/0.5ML SOPN Inject 1.5 mg into the skin once a week.    . escitalopram (LEXAPRO) 10 MG tablet Take 10 mg by mouth daily.    . isosorbide mononitrate (IMDUR) 60 MG 24 hr tablet TAKE 1 TABLET BY MOUTH ONCE DAILY *PATIENT NEEDS OFFICE VISIT* 90 tablet 3  . losartan-hydrochlorothiazide (HYZAAR) 100-12.5 MG per tablet Take 1 tablet by mouth daily.     . metFORMIN (GLUCOPHAGE XR) 500 MG 24 hr tablet Take 4 tablets (2,000 mg total) by mouth daily with breakfast. HOLD for 48 hours, restart on 10/27/2014. (Patient taking differently: Take 1,000 mg by mouth 2 (two) times daily. HOLD for 48 hours, restart on 10/27/2014.) 120 tablet 11  . metoprolol tartrate (LOPRESSOR) 100 MG tablet TAKE 1 TABLET BY MOUTH TWICE DAILY 180 tablet 3  . naproxen sodium (ALEVE) 220 MG tablet Take 220-440 mg by mouth 2 (two) times daily as needed.    . nitroGLYCERIN (NITROSTAT) 0.4 MG SL tablet Place 1 tablet (0.4 mg total) under the tongue every 5 (five) minutes x 3 doses as needed for chest  pain. If no relief after 3rd dose, proceed to the ED for an evaluation 25 tablet 12   No current facility-administered medications for this visit.    Allergies:  Contrast media [iodinated diagnostic agents]; Sulfa drugs cross reactors; and Penicillins   Social History: The patient  reports that she has never smoked. She has never used smokeless tobacco. She reports that she does not drink alcohol or use drugs.   Family History: The patient's family history includes Cancer in her father; Heart disease in her mother.   ROS:  Please see the history of present illness. Otherwise, complete review of systems is positive for {NONE  DEFAULTED:18576::"none"}.  All other systems are reviewed and negative.   Physical Exam: VS:  There were no vitals taken for this visit., BMI There is no height or weight on file to calculate BMI.  Wt Readings from Last 3 Encounters:  12/28/17 282 lb (127.9 kg)  06/08/17 265 lb 12.8 oz (120.6 kg)  07/21/16 272 lb (123.4 kg)    General: Patient appears comfortable at rest. HEENT: Conjunctiva and lids normal, oropharynx clear with moist mucosa. Neck: Supple, no elevated JVP or carotid bruits, no thyromegaly. Lungs: Clear to auscultation, nonlabored breathing at rest. Cardiac: Regular rate and rhythm, no S3 or significant systolic murmur, no pericardial rub. Abdomen: Soft, nontender, no hepatomegaly, bowel sounds present, no guarding or rebound. Extremities: No pitting edema, distal pulses 2+. Skin: Warm and dry. Musculoskeletal: No kyphosis. Neuropsychiatric: Alert and oriented x3, affect grossly appropriate.  ECG: I personally reviewed the tracing from 06/08/2017 which showed sinus rhythm with leftward axis and LVH.  Recent Labwork:  06/24/2016: BUN 13; Creatinine, Ser 0.66; Hemoglobin 12.4; Platelets 196; Potassium 3.8; Sodium 140  Other Studies Reviewed Today:  Cardiac catheterization 06/24/2016:  Ost 3rd Diag to 3rd Diag lesion - jailed by stent in LAD, 55% stenosed. Angiographic similar to last catheterization  Widely patent mid LAD stent  Normal EF with normal EDP  Angiographically no significant difference in findings from last catheterization. L diagonal appears to be very similar to last catheterization. There could be some spasm of the slight, but this is not a good PCI target as it was not last time. The ostial nature of the lesion and size discrepancy between the diagonal and the LAD makes it very difficult PCI target. Would only be able to do balloon angioplasty which would be suboptimal.  Assessment and Plan:    Current medicines were reviewed with the patient  today.  No orders of the defined types were placed in this encounter.   Disposition:  Signed, Jonelle Sidle, MD, [facility] 06/29/2018 6:42 PM     [facility] at [facility] [address] [place], [place], [place] [address] Phone: [phone]; Fax: [phone]

## 2018-07-03 ENCOUNTER — Ambulatory Visit: Payer: 59 | Admitting: Cardiology

## 2018-07-06 NOTE — Progress Notes (Signed)
Cardiology Office Note  Date: 07/07/2018   ID: Andrea Hughes, MRN [id number]  PCP: Andrea Flavin, MD  Primary Cardiologist: Andrea Dell, MD   Chief Complaint  Patient presents with  . Coronary Artery Disease    History of Present Illness: Andrea Hughes is a 62 y.o. female last seen in January.  She is here for a routine visit.  Reports no angina symptoms or nitroglycerin use.  She tells me that she recently was fired from her job, but does not seem to be too upset, reports less stress.  She and her husband are getting ready to go on a cruise for their 42nd anniversary.  I reviewed her medications.  Cardiac regimen includes aspirin, Lipitor, Hyzaar, Imdur, Lopressor, and as needed nitroglycerin.  We are requesting her most recent lab work from [facility].  I personally reviewed her ECG today which shows a sinus rhythm with increased voltage and old anteroseptal infarct pattern.  Past Medical History:  Diagnosis Date  . CAD (coronary artery disease)    a. 10/18/2014 NSTEMI, DES to mLAD 10/21/2014 b. 10/23/2014 NSTEMI, cath with patent stent, moderate stenosis in D3 enzyme elevation felt secondary to spasm with mild plaque shift in the diagonal  . Depression   . Essential hypertension   . Hyperlipidemia   . Morbid obesity (HCC)   . NSTEMI (non-ST elevated myocardial infarction) [facility]) October 2015  . Type 2 diabetes mellitus (HCC)     Past Surgical History:  Procedure Laterality Date  . BACK SURGERY  1988  . BLADDER SUSPENSION    . BREAST SURGERY     1984  . CARDIAC CATHETERIZATION N/A 06/24/2016   Procedure: Left Heart Cath and Coronary Angiography;  Surgeon: Andrea Lex, MD;  [facility]: [facility];  Service: Cardiovascular;  Laterality: N/A;  . CHOLECYSTECTOMY  2004  . COLONOSCOPY  09/07/2011   Procedure: COLONOSCOPY;  Surgeon: Dalia Heading;  [facility];  Service: Gastroenterology;  Laterality: N/A;  . LEFT HEART  CATHETERIZATION WITH CORONARY ANGIOGRAM N/A 10/21/2014   Procedure: LEFT HEART CATHETERIZATION WITH CORONARY ANGIOGRAM;  Surgeon: Andrea Lex, MD;  Location: [facility] C[facility];  Service: Cardiovascular;  Laterality: N/A;  . LEFT HEART CATHETERIZATION WITH CORONARY ANGIOGRAM N/A 10/24/2014   Procedure: LEFT HEART CATHETERIZATION WITH CORONARY ANGIOGRAM;  Surgeon: Andrea Lex, MD;  Location: [facility] C[facility] LAB;  Service: Cardiovascular;  Laterality: N/A;  . TONSILLECTOMY     62 years old  . TUBAL LIGATION    . VAGINAL HYSTERECTOMY      Current Outpatient Medications  Medication Sig Dispense Refill  . atorvastatin (LIPITOR) 80 MG tablet TAKE 1 TABLET BY MOUTH ONCE DAILY AT 6PM 90 tablet 3  . clonazePAM (KLONOPIN) 1 MG tablet Take 1 mg by mouth as needed for anxiety.    . diphenhydrAMINE (BENADRYL) 25 MG tablet Take 25 mg by mouth as needed for allergies or sleep.    . Dulaglutide (TRULICITY) 1.5 MG/0.5ML SOPN Inject 1.5 mg into the skin once a week.    . escitalopram (LEXAPRO) 10 MG tablet Take 10 mg by mouth daily.    . isosorbide mononitrate (IMDUR) 60 MG 24 hr tablet TAKE 1 TABLET BY MOUTH ONCE DAILY *PATIENT NEEDS OFFICE VISIT* 90 tablet 3  . losartan-hydrochlorothiazide (HYZAAR) 100-12.5 MG per tablet Take 1 tablet by mouth daily.     . metFORMIN (GLUCOPHAGE XR) 500 MG 24 hr tablet Take 4 tablets (2,000 mg total) by mouth  daily with breakfast. HOLD for 48 hours, restart on 10/27/2014. (Patient taking differently: Take 1,000 mg by mouth 2 (two) times daily. HOLD for 48 hours, restart on 10/27/2014.) 120 tablet 11  . metoprolol tartrate (LOPRESSOR) 100 MG tablet TAKE 1 TABLET BY MOUTH TWICE DAILY 180 tablet 3  . nitroGLYCERIN (NITROSTAT) 0.4 MG SL tablet Place 1 tablet (0.4 mg total) under the tongue every 5 (five) minutes x 3 doses as needed for chest pain. If no relief after 3rd dose, proceed to the ED for an evaluation 25 tablet 3  . aspirin EC 81 MG tablet Take 1 tablet (81 mg total) by mouth  daily. 90 tablet 3   No current facility-administered medications for this visit.    Allergies:  Contrast media [iodinated diagnostic agents]; Sulfa drugs cross reactors; and Penicillins   Social History: The patient  reports that she has never smoked. She has never used smokeless tobacco. She reports that she does not drink alcohol or use drugs.   ROS:  Please see the history of present illness. Otherwise, complete review of systems is positive for none.  All other systems are reviewed and negative.   Physical Exam: VS:  BP 130/80   Pulse 78   Ht 5\' 4"  (1.626 m)   Wt 266 lb (120.7 kg)   SpO2 96%   BMI 45.66 kg/m , BMI Body mass index is 45.66 kg/m.  Wt Readings from Last 3 Encounters:  07/07/18 266 lb (120.7 kg)  12/28/17 282 lb (127.9 kg)  06/08/17 265 lb 12.8 oz (120.6 kg)    General: Patient appears comfortable at rest. HEENT: Conjunctiva and lids normal, oropharynx clear. Neck: Supple, no elevated JVP or carotid bruits, no thyromegaly. Lungs: Clear to auscultation, nonlabored breathing at rest. Cardiac: Regular rate and rhythm, no S3 or significant systolic murmur, no pericardial rub. Abdomen: Obese, nontender, bowel sounds present. Extremities: No pitting edema, distal pulses 2+. Skin: Warm and dry. Musculoskeletal: No kyphosis. Neuropsychiatric: Alert and oriented x3, affect grossly appropriate.  ECG: I personally reviewed the tracing from 06/08/2017 which showed sinus rhythm with leftward axis and LVH.  Recent Labwork:  06/24/2016: BUN 13; Creatinine, Ser 0.66; Hemoglobin 12.4; Platelets 196; Potassium 3.8; Sodium 140  Other Studies Reviewed Today:  Cardiac catheterization 06/24/2016:  Ost 3rd Diag to 3rd Diag lesion - jailed by stent in LAD, 55% stenosed. Angiographic similar to last catheterization  Widely patent mid LAD stent  Normal EF with normal EDP  Angiographically no significant difference in findings from last catheterization. L diagonal appears to  be very similar to last catheterization. There could be some spasm of the slight, but this is not a good PCI target as it was not last time. The ostial nature of the lesion and size discrepancy between the diagonal and the LAD makes it very difficult PCI target. Would only be able to do balloon angioplasty which would be suboptimal.  Assessment and Plan:  1.  CAD with history of DES to the LAD, patent at angiography in 2017 with chronically jailed diagonal branch that has been managed medically.  She is doing well without active angina symptoms.  Refill provided for fresh bottle of nitroglycerin.  Otherwise continue present regimen.  2.  Essential hypertension, blood pressure control is adequate today.  3.  Mixed hyperlipidemia on Lipitor.  She is following with [facility], requesting most recent lab work.  4.  Type 2 diabetes mellitus, on Glucophage with follow-up per [facility].  Current medicines were reviewed with  the patient today.   Orders Placed This Encounter  Procedures  . EKG 12-Lead    Disposition: Follow-up in 6 months.  Signed, Jonelle SidleSamuel G. McDowell, MD, [facility] 07/07/2018 10:02 AM    [facility] [address], [place], [place] [address] Phone: [phone]) [phone]; Fax: [phone]

## 2018-07-07 ENCOUNTER — Encounter: Payer: Self-pay | Admitting: Cardiology

## 2018-07-07 ENCOUNTER — Ambulatory Visit: Payer: 59 | Admitting: Cardiology

## 2018-07-07 VITALS — BP 130/80 | HR 78 | Ht 64.0 in | Wt 266.0 lb

## 2018-07-07 DIAGNOSIS — E782 Mixed hyperlipidemia: Secondary | ICD-10-CM | POA: Diagnosis not present

## 2018-07-07 DIAGNOSIS — I25119 Atherosclerotic heart disease of native coronary artery with unspecified angina pectoris: Secondary | ICD-10-CM

## 2018-07-07 DIAGNOSIS — E1159 Type 2 diabetes mellitus with other circulatory complications: Secondary | ICD-10-CM

## 2018-07-07 DIAGNOSIS — I10 Essential (primary) hypertension: Secondary | ICD-10-CM | POA: Diagnosis not present

## 2018-07-07 MED ORDER — ASPIRIN EC 81 MG PO TBEC: 81.0000 mg | DELAYED_RELEASE_TABLET | Freq: Every day | ORAL | 3 refills | Status: DC

## 2018-07-07 MED ORDER — NITROGLYCERIN 0.4 MG SL SUBL: 0.4000 mg | SUBLINGUAL_TABLET | SUBLINGUAL | 3 refills | Status: DC | PRN

## 2018-07-07 NOTE — Patient Instructions (Signed)
Medication Instructions:  Your physician has recommended you make the following change in your medication:    DECREASE Aspirin 81 mg    Please continue all other medications as prescribed   Labwork: NONE  Testing/Procedures: NONE  Follow-Up: Your physician wants you to follow-up in: 6 MONTHS WITH DR. MCDOWELL. You will receive a reminder letter in the mail two months in advance. If you don't receive a letter, please call our office to schedule the follow-up appointment.  Any Other Special Instructions Will Be Listed Below (If Applicable).  If you need a refill on your cardiac medications before your next appointment, please call your pharmacy.

## 2018-07-23 ENCOUNTER — Other Ambulatory Visit: Payer: Self-pay | Admitting: Cardiology

## 2019-01-25 ENCOUNTER — Ambulatory Visit: Payer: Self-pay | Admitting: Cardiology

## 2019-03-02 NOTE — Progress Notes (Signed)
Cardiology Office Note  Date: 03/05/2019   ID: Andrea Hughes, DOB 04/29/1956, MRN [id number]  PCP: Juliette Alcide, MD  Primary Cardiologist: Nona Dell, MD   Chief Complaint  Patient presents with  . Coronary Artery Disease    History of Present Illness: Andrea Hughes is a 63 y.o. female last seen in July 2019.  She is here for a routine follow-up visit.  She does not report any active angina symptoms at this time on medical therapy.  She is now seeing Dr. Leandrew Koyanagi since Dr. Dimas Aguas retired.  I did review her lab work from last year.  Current cardiac regimen includes aspirin, Lipitor, Imdur, Hyzaar, Lopressor, and as needed nitroglycerin which she has not used.  Past Medical History:  Diagnosis Date  . CAD (coronary artery disease)    a. 10/18/2014 NSTEMI, DES to mLAD 10/21/2014 b. 10/23/2014 NSTEMI, cath with patent stent, moderate stenosis in D3 enzyme elevation felt secondary to spasm with mild plaque shift in the diagonal  . Depression   . Essential hypertension   . Hyperlipidemia   . Morbid obesity (HCC)   . NSTEMI (non-ST elevated myocardial infarction) [facility]) October 2015  . Type 2 diabetes mellitus (HCC)     Past Surgical History:  Procedure Laterality Date  . BACK SURGERY  1988  . BLADDER SUSPENSION    . BREAST SURGERY     1984  . CARDIAC CATHETERIZATION N/A 06/24/2016   Procedure: Left Heart Cath and Coronary Angiography;  Surgeon: Marykay Lex, MD;  Location: [facility] INVASIVE CV LAB;  Service: Cardiovascular;  Laterality: N/A;  . CHOLECYSTECTOMY  2004  . COLONOSCOPY  09/07/2011   Procedure: COLONOSCOPY;  Surgeon: Dalia Heading;  [facility];  Service: Gastroenterology;  Laterality: N/A;  . LEFT HEART CATHETERIZATION WITH CORONARY ANGIOGRAM N/A 10/21/2014   Procedure: LEFT HEART CATHETERIZATION WITH CORONARY ANGIOGRAM;  Surgeon: Marykay Lex, MD;  Location: [facility] CATH LAB;  Service: Cardiovascular;  Laterality: N/A;  . LEFT HEART  CATHETERIZATION WITH CORONARY ANGIOGRAM N/A 10/24/2014   Procedure: LEFT HEART CATHETERIZATION WITH CORONARY ANGIOGRAM;  Surgeon: Marykay Lex, MD;  [facility]: [facility] CATH LAB;  Service: Cardiovascular;  Laterality: N/A;  . TONSILLECTOMY     63 years old  . TUBAL LIGATION    . VAGINAL HYSTERECTOMY      Current Outpatient Medications  Medication Sig Dispense Refill  . atorvastatin (LIPITOR) 80 MG tablet TAKE 1 TABLET BY MOUTH ONCE DAILY AT  6  PM 30 tablet 6  . clonazePAM (KLONOPIN) 1 MG tablet Take 1 mg by mouth as needed for anxiety.    . diphenhydrAMINE (BENADRYL) 25 MG tablet Take 25 mg by mouth as needed for allergies or sleep.    Marland Kitchen escitalopram (LEXAPRO) 10 MG tablet Take 10 mg by mouth daily.    Marland Kitchen glipiZIDE (GLUCOTROL) 10 MG tablet Take 10 mg by mouth daily before breakfast.    . isosorbide mononitrate (IMDUR) 60 MG 24 hr tablet TAKE 1 TABLET BY MOUTH ONCE DAILY **PATIENT  NEEDS  OFFICE  VISIT** 30 tablet 6  . losartan-hydrochlorothiazide (HYZAAR) 100-12.5 MG per tablet Take 1 tablet by mouth daily.     . metFORMIN (GLUCOPHAGE XR) 500 MG 24 hr tablet Take 4 tablets (2,000 mg total) by mouth daily with breakfast. HOLD for 48 hours, restart on 10/27/2014. (Patient taking differently: Take 1,000 mg by mouth 2 (two) times daily. HOLD for 48 hours, restart on 10/27/2014.) 120 tablet 11  . metoprolol  tartrate (LOPRESSOR) 100 MG tablet TAKE 1 TABLET BY MOUTH TWICE DAILY 180 tablet 3  . nitroGLYCERIN (NITROSTAT) 0.4 MG SL tablet Place 1 tablet (0.4 mg total) under the tongue every 5 (five) minutes x 3 doses as needed for chest pain. If no relief after 3rd dose, proceed to the ED for an evaluation 25 tablet 3  . aspirin EC 81 MG tablet Take 1 tablet (81 mg total) by mouth daily.     No current facility-administered medications for this visit.    Allergies:  Contrast media [iodinated diagnostic agents]; Sulfa drugs cross reactors; and Penicillins   Social History: The patient  reports that she  has never smoked. She has never used smokeless tobacco. She reports that she does not drink alcohol or use drugs.   ROS:  Please see the history of present illness. Otherwise, complete review of systems is positive for none.  All other systems are reviewed and negative.   Physical Exam: VS:  BP 124/90   Pulse 64   Ht 5\' 5"  (1.651 m)   Wt 285 lb (129.3 kg)   SpO2 98%   BMI 47.43 kg/m , BMI Body mass index is 47.43 kg/m.  Wt Readings from Last 3 Encounters:  03/05/19 285 lb (129.3 kg)  07/07/18 266 lb (120.7 kg)  12/28/17 282 lb (127.9 kg)    General: Patient appears comfortable at rest. HEENT: Conjunctiva and lids normal, oropharynx clear. Neck: Supple, no elevated JVP or carotid bruits, no thyromegaly. Lungs: Clear to auscultation, nonlabored breathing at rest. Cardiac: Regular rate and rhythm, no S3 or significant systolic murmur. Abdomen: Soft, nontender, bowel sounds present. Extremities: No pitting edema, distal pulses 2+.  ECG: I personally reviewed the tracing from 07/07/2018 which showed sinus rhythm with increased voltage and old anteroseptal infarct pattern.  Recent Labwork:  April 2019: Hemoglobin 11.7, platelets 171, BUN 13, creatinine 0.72, potassium 4.3, AST 11, ALT 12, cholesterol 129, triglycerides 91, HDL 50, LDL 61, hemoglobin A1c 7%  Other Studies Reviewed Today:  Cardiac catheterization 06/24/2016:  Ost 3rd Diag to 3rd Diag lesion - jailed by stent in LAD, 55% stenosed. Angiographic similar to last catheterization  Widely patent mid LAD stent  Normal EF with normal EDP  Angiographically no significant difference in findings from last catheterization. L diagonal appears to be very similar to last catheterization. There could be some spasm of the slight, but this is not a good PCI target as it was not last time. The ostial nature of the lesion and size discrepancy between the diagonal and the LAD makes it very difficult PCI target. Would only be able to do  balloon angioplasty which would be suboptimal.  Assessment and Plan:  1.  CAD status post DES to the LAD with chronically jailed diagonal branch.  She is doing well without active angina on medical therapy.  No changes were made today.  2.  Mixed hyperlipidemia, continues on Lipitor with follow-up by Dr. Leandrew Koyanagi.  Last LDL was 61.  Current medicines were reviewed with the patient today.  Disposition: Follow-up in 6 months.  Signed, Jonelle Sidle, MD, [facility] 03/05/2019 10:01 AM    [facility] at [facility] [address] [place], [place], [place] [address] Phone: [phone]; Fax: [phone]

## 2019-03-05 ENCOUNTER — Encounter: Payer: Self-pay | Admitting: Cardiology

## 2019-03-05 ENCOUNTER — Ambulatory Visit (INDEPENDENT_AMBULATORY_CARE_PROVIDER_SITE_OTHER): Payer: Self-pay | Admitting: Cardiology

## 2019-03-05 VITALS — BP 124/90 | HR 64 | Ht 65.0 in | Wt 285.0 lb

## 2019-03-05 DIAGNOSIS — I25119 Atherosclerotic heart disease of native coronary artery with unspecified angina pectoris: Secondary | ICD-10-CM

## 2019-03-05 DIAGNOSIS — E782 Mixed hyperlipidemia: Secondary | ICD-10-CM

## 2019-03-05 MED ORDER — ASPIRIN EC 81 MG PO TBEC: 81.0000 mg | DELAYED_RELEASE_TABLET | Freq: Every day | ORAL | Status: DC

## 2019-03-05 NOTE — Patient Instructions (Signed)
Medication Instructions:   Begin Aspirin 81mg  daily.   Continue all other medications.      Follow-Up: Your physician wants you to follow up in: 6 months.  You will receive a reminder letter in the mail one-two months in advance.  If you don't receive a letter, please call our office to schedule the follow up appointment    Any Other Special Instructions Will Be Listed Below (If Applicable).  If you need a refill on your cardiac medications before your next appointment, please call your pharmacy.

## 2019-11-06 ENCOUNTER — Ambulatory Visit (INDEPENDENT_AMBULATORY_CARE_PROVIDER_SITE_OTHER): Payer: Self-pay | Admitting: Cardiology

## 2019-11-06 ENCOUNTER — Other Ambulatory Visit: Payer: Self-pay

## 2019-11-06 ENCOUNTER — Encounter: Payer: Self-pay | Admitting: Cardiology

## 2019-11-06 VITALS — BP 150/80 | HR 66 | Ht 64.0 in | Wt 296.0 lb

## 2019-11-06 DIAGNOSIS — E782 Mixed hyperlipidemia: Secondary | ICD-10-CM

## 2019-11-06 DIAGNOSIS — E1165 Type 2 diabetes mellitus with hyperglycemia: Secondary | ICD-10-CM

## 2019-11-06 DIAGNOSIS — I25119 Atherosclerotic heart disease of native coronary artery with unspecified angina pectoris: Secondary | ICD-10-CM

## 2019-11-06 DIAGNOSIS — I10 Essential (primary) hypertension: Secondary | ICD-10-CM

## 2019-11-06 NOTE — Patient Instructions (Addendum)

## 2019-11-06 NOTE — Progress Notes (Signed)
Cardiology Office Note  Date: 11/06/2019   ID: Andrea Hughes, Andrea Hughes 1956-09-17, MRN [id number]  PCP:  Curlene Labrum, MD  Cardiologist:  Rozann Lesches, MD Electrophysiologist:  None   Chief Complaint  Patient presents with   Cardiac follow-up    History of Present Illness: Andrea Hughes is a 63 y.o. female last seen in March.  She presents for a routine visit.  She does not report any angina symptoms.  She has felt fatigued, not exercising, also watching her grandkids daily as they do virtual school at home.  I reviewed her medications which are outlined below.  She states that she has been compliant with therapy.  She has not had a follow-up yet with PCP for lab work, awaiting renewed health insurance in January.  I personally reviewed her ECG today which shows sinus rhythm with poor R wave progression, lead motion artifact, nonspecific ST-T changes.  Past Medical History:  Diagnosis Date   CAD (coronary artery disease)    a. 10/18/2014 NSTEMI, DES to mLAD 10/21/2014 b. 10/23/2014 NSTEMI, cath with patent stent, moderate stenosis in D3 enzyme elevation felt secondary to spasm with mild plaque shift in the diagonal   Depression    Essential hypertension    Hyperlipidemia    Morbid obesity ([place])    NSTEMI (non-ST elevated myocardial infarction) [facility]) October 2015   Type 2 diabetes mellitus [facility])     Past Surgical History:  Procedure Laterality Date   [place] N/A 06/24/2016   Procedure: Left Heart Cath and Coronary Angiography;  Surgeon: Leonie Man, MD;  Location: [place] CV LAB;  Service: Cardiovascular;  Laterality: N/A;   CHOLECYSTECTOMY  2004   COLONOSCOPY  09/07/2011   Procedure: COLONOSCOPY;  Surgeon: Jamesetta So;  [facility];  Service: Gastroenterology;  Laterality: N/A;   LEFT HEART CATHETERIZATION WITH CORONARY ANGIOGRAM N/A  10/21/2014   Procedure: LEFT HEART CATHETERIZATION WITH CORONARY ANGIOGRAM;  Surgeon: Leonie Man, MD;  Location: [facility] CATH LAB;  Service: Cardiovascular;  Laterality: N/A;   LEFT HEART CATHETERIZATION WITH CORONARY ANGIOGRAM N/A 10/24/2014   Procedure: LEFT HEART CATHETERIZATION WITH CORONARY ANGIOGRAM;  Surgeon: Leonie Man, MD;  Location: [facility] CATH LAB;  Service: Cardiovascular;  Laterality: N/A;   TONSILLECTOMY     63 years old   TUBAL LIGATION     VAGINAL HYSTERECTOMY      Current Outpatient Medications  Medication Sig Dispense Refill   aspirin EC 81 MG tablet Take 1 tablet (81 mg total) by mouth daily.     atorvastatin (LIPITOR) 80 MG tablet TAKE 1 TABLET BY MOUTH ONCE DAILY AT  6  PM 30 tablet 6   clonazePAM (KLONOPIN) 1 MG tablet Take 1 mg by mouth as needed for anxiety.     diphenhydrAMINE (BENADRYL) 25 MG tablet Take 25 mg by mouth as needed for allergies or sleep.     escitalopram (LEXAPRO) 10 MG tablet Take 10 mg by mouth daily.     fluticasone (FLONASE) 50 MCG/ACT nasal spray Place into both nostrils daily.     glipiZIDE (GLUCOTROL) 10 MG tablet Take 10 mg by mouth daily before breakfast.     isosorbide mononitrate (IMDUR) 60 MG 24 hr tablet TAKE 1 TABLET BY MOUTH ONCE DAILY **PATIENT  NEEDS  OFFICE  VISIT** 30 tablet 6   metFORMIN (GLUCOPHAGE XR)  500 MG 24 hr tablet Take 4 tablets (2,000 mg total) by mouth daily with breakfast. HOLD for 48 hours, restart on 10/27/2014. (Patient taking differently: Take 1,000 mg by mouth 2 (two) times daily. HOLD for 48 hours, restart on 10/27/2014.) 120 tablet 11   metoprolol tartrate (LOPRESSOR) 100 MG tablet TAKE 1 TABLET BY MOUTH TWICE DAILY 180 tablet 3   nitroGLYCERIN (NITROSTAT) 0.4 MG SL tablet Place 1 tablet (0.4 mg total) under the tongue every 5 (five) minutes x 3 doses as needed for chest pain. If no relief after 3rd dose, proceed to the ED for an evaluation 25 tablet 3   No current facility-administered medications  for this visit.    Allergies:  Contrast media [iodinated diagnostic agents], Sulfa drugs cross reactors, and Penicillins   Social History: The patient  reports that she has never smoked. She has never used smokeless tobacco. She reports that she does not drink alcohol or use drugs.   ROS:  Please see the history of present illness. Otherwise, complete review of systems is positive for arthritic pains.  All other systems are reviewed and negative.   Physical Exam: VS:  BP (!) 150/80    Pulse 66    Ht 5\' 4"  (1.626 m)    Wt 296 lb (134.3 kg)    SpO2 98%    BMI 50.81 kg/m , BMI Body mass index is 50.81 kg/m.  Wt Readings from Last 3 Encounters:  11/06/19 296 lb (134.3 kg)  03/05/19 285 lb (129.3 kg)  07/07/18 266 lb (120.7 kg)    General: Patient appears comfortable at rest. HEENT: Conjunctiva and lids normal, wearing a mask. Neck: Supple, no elevated JVP or carotid bruits, no thyromegaly. Lungs: Clear to auscultation, nonlabored breathing at rest. Cardiac: Regular rate and rhythm, no S3 or significant systolic murmur. Abdomen: Soft, nontender, bowel sounds present. Extremities: No pitting edema, distal pulses 2+. Skin: Warm and dry. Musculoskeletal: No kyphosis. Neuropsychiatric: Alert and oriented x3, affect grossly appropriate.  ECG:  An ECG dated 07/07/2018 was personally reviewed today and demonstrated:  Sinus rhythm with LVH and poor R wave progression, nonspecific ST changes.  Recent Labwork:  April 2019: Hemoglobin 11.7, platelets 171, BUN 13, creatinine 0.72, potassium 4.3, AST 11, ALT 12, cholesterol 129, triglycerides 91, HDL 50, LDL 61, hemoglobin A1c 7%  Other Studies Reviewed Today:  Cardiac catheterization 06/24/2016:  Ost 3rd Diag to 3rd Diag lesion - jailed by stent in LAD, 55% stenosed. Angiographic similar to last catheterization  Widely patent mid LAD stent  Normal EF with normal EDP  Angiographically no significant difference in findings from last  catheterization. L diagonal appears to be very similar to last catheterization. There could be some spasm of the slight, but this is not a good PCI target as it was not last time. The ostial nature of the lesion and size discrepancy between the diagonal and the LAD makes it very difficult PCI target. Would only be able to do balloon angioplasty which would be suboptimal.  Assessment and Plan:  1.  CAD status post DES to the LAD with chronically jailed diagonal branch.  She reports no active angina on medical therapy.  I reviewed her ECG which is stable.  Continue aspirin, Lipitor, Imdur, and Lopressor.  I asked her to consider a regular walking plan for exercise.  2.  Mixed hyperlipidemia, tolerating Lipitor.  Last LDL was 61.  She will have follow-up lab work with PCP early next year.  3.  Essential hypertension,  blood pressure elevated today in the 150s.  She states that she has been taking her medications regularly.  We talked about diet and exercise plan.  Keep follow-up with PCP.  4.  Type 2 diabetes mellitus, on Glucophage Exar and Glucotrol.  Hemoglobin A1c was 7% last year.  Follow-up with PCP.  Medication Adjustments/Labs and Tests Ordered: Current medicines are reviewed at length with the patient today.  Concerns regarding medicines are outlined above.   Tests Ordered: Orders Placed This Encounter  Procedures   EKG 12-Lead    Medication Changes: No orders of the defined types were placed in this encounter.   Disposition:  Follow up 6 months in the [place] office.  Signed, Jonelle Sidle, MD, [facility] 11/06/2019 2:20 PM    [facility] at [facility] [address] [place], [place], [place] [address] Phone: [phone]; Fax: [phone]

## 2020-01-30 ENCOUNTER — Other Ambulatory Visit (HOSPITAL_COMMUNITY): Payer: Self-pay | Admitting: Family Medicine

## 2020-01-30 DIAGNOSIS — Z1231 Encounter for screening mammogram for malignant neoplasm of breast: Secondary | ICD-10-CM

## 2020-02-07 ENCOUNTER — Ambulatory Visit (HOSPITAL_COMMUNITY)
Admission: RE | Admit: 2020-02-07 | Discharge: 2020-02-07 | Disposition: A | Discharge status: Home / Self Care | Payer: 59 | Source: Ambulatory Visit | Attending: Family Medicine | Admitting: Family Medicine

## 2020-02-07 ENCOUNTER — Encounter (HOSPITAL_COMMUNITY): Payer: Self-pay

## 2020-02-07 ENCOUNTER — Other Ambulatory Visit: Payer: Self-pay

## 2020-02-07 DIAGNOSIS — Z1231 Encounter for screening mammogram for malignant neoplasm of breast: Secondary | ICD-10-CM | POA: Insufficient documentation

## 2020-05-14 NOTE — Progress Notes (Signed)
Cardiology Office Note  Date: 05/15/2020   ID: Jacquelyne, Quarry 10-23-1956, MRN [id number]  PCP:  Juliette Alcide, MD  Cardiologist:  Nona Dell, MD Electrophysiologist:  None   Chief Complaint  Patient presents with   Cardiac follow-up    History of Present Illness: Andrea Hughes is a 64 y.o. female last seen in November 2020.  She presents for a routine visit.  From a cardiac perspective, she does not report any active angina symptoms or nitroglycerin use.  I reviewed her medications which are outlined below and stable from a cardiac perspective.  She did need refills on Imdur and as needed nitroglycerin.  She does not report any medication intolerances and states that she had lab work with Dr. Leandrew Koyanagi, we are requesting her most recent lipid panel.  I do see that her most recent hemoglobin A1c was up to 8.8%.   Past Medical History:  Diagnosis Date   CAD (coronary artery disease)    a. 10/18/2014 NSTEMI, DES to mLAD 10/21/2014 b. 10/23/2014 NSTEMI, cath with patent stent, moderate stenosis in D3 enzyme elevation felt secondary to spasm with mild plaque shift in the diagonal   Depression    Essential hypertension    Hyperlipidemia    Morbid obesity (HCC)    NSTEMI (non-ST elevated myocardial infarction) [facility]) October 2015   Type 2 diabetes mellitus [facility])     Past Surgical History:  Procedure Laterality Date   BACK SURGERY  1988   BLADDER SUSPENSION     BREAST SURGERY     1984   CARDIAC CATHETERIZATION N/A 06/24/2016   Procedure: Left Heart Cath and Coronary Angiography;  Surgeon: Marykay Lex, MD;  Location: [facility] INVASIVE CV LAB;  Service: Cardiovascular;  Laterality: N/A;   CHOLECYSTECTOMY  2004   COLONOSCOPY  09/07/2011   Procedure: COLONOSCOPY;  Surgeon: Dalia Heading;  [facility];  Service: Gastroenterology;  Laterality: N/A;   LEFT HEART CATHETERIZATION WITH CORONARY ANGIOGRAM N/A 10/21/2014   Procedure: LEFT HEART  CATHETERIZATION WITH CORONARY ANGIOGRAM;  Surgeon: Marykay Lex, MD;  Location: [facility] CATH LAB;  Service: Cardiovascular;  Laterality: N/A;   LEFT HEART CATHETERIZATION WITH CORONARY ANGIOGRAM N/A 10/24/2014   Procedure: LEFT HEART CATHETERIZATION WITH CORONARY ANGIOGRAM;  Surgeon: Marykay Lex, MD;  Location: [facility] CATH LAB;  Service: Cardiovascular;  Laterality: N/A;   TONSILLECTOMY     64 years old   TUBAL LIGATION     VAGINAL HYSTERECTOMY      Current Outpatient Medications  Medication Sig Dispense Refill   aspirin EC 325 MG tablet Take 325 mg by mouth daily.     atorvastatin (LIPITOR) 80 MG tablet TAKE 1 TABLET BY MOUTH ONCE DAILY AT  6  PM 30 tablet 6   clonazePAM (KLONOPIN) 1 MG tablet Take 1 mg by mouth as needed for anxiety.     diphenhydrAMINE (BENADRYL) 25 MG tablet Take 25 mg by mouth as needed for allergies or sleep.     escitalopram (LEXAPRO) 10 MG tablet Take 10 mg by mouth daily.     fluticasone (FLONASE) 50 MCG/ACT nasal spray Place into both nostrils daily.     glipiZIDE (GLUCOTROL) 10 MG tablet Take 10 mg by mouth 2 (two) times daily before a meal.      isosorbide mononitrate (IMDUR) 60 MG 24 hr tablet TAKE 1 TABLET BY MOUTH ONCE DAILY 90 tablet 2   metFORMIN (GLUCOPHAGE XR) 500 MG 24 hr tablet Take 4 tablets (  2,000 mg total) by mouth daily with breakfast. HOLD for 48 hours, restart on 10/27/2014. (Patient taking differently: Take 1,000 mg by mouth 2 (two) times daily. HOLD for 48 hours, restart on 10/27/2014.) 120 tablet 11   metoprolol tartrate (LOPRESSOR) 100 MG tablet TAKE 1 TABLET BY MOUTH TWICE DAILY 180 tablet 3   nitroGLYCERIN (NITROSTAT) 0.4 MG SL tablet Place 1 tablet (0.4 mg total) under the tongue every 5 (five) minutes x 3 doses as needed for chest pain (if no relief after 3rd dose, proceed to the ED for an evaluation or call 911). If no relief after 3rd dose, proceed to the ED for an evaluation 25 tablet 3   OMEPRAZOLE PO Take by mouth.     No  current facility-administered medications for this visit.   Allergies:  Contrast media [iodinated diagnostic agents], Sulfa drugs cross reactors, and Penicillins   ROS:   Reflux and intermittent dysphagia, states that she was started on omeprazole by Dr. Pleas Koch and symptoms have improved for now.  Physical Exam: VS:  BP 130/80    Pulse 70    Ht 5\' 4"  (1.626 m)    Wt 283 lb (128.4 kg)    SpO2 98%    BMI 48.58 kg/m , BMI Body mass index is 48.58 kg/m.  Wt Readings from Last 3 Encounters:  05/15/20 283 lb (128.4 kg)  11/06/19 296 lb (134.3 kg)  03/05/19 285 lb (129.3 kg)    General: Patient appears comfortable at rest. HEENT: Conjunctiva and lids normal, wearing a mask. Neck: Supple, no elevated JVP or carotid bruits, no thyromegaly. Lungs: Clear to auscultation, nonlabored breathing at rest. Cardiac: Regular rate and rhythm, no S3 or significant systolic murmur, no pericardial rub. Extremities: No pitting edema, distal pulses 2+.  ECG:  An ECG dated 11/06/2019 was personally reviewed today and demonstrated:  Sinus rhythm with poor R wave progression, lead motion artifact, nonspecific ST-T changes.  Recent Labwork:  April 2019: Hemoglobin 11.7, platelets 171, BUN 13, creatinine 0.72, potassium 4.3, AST 11, ALT 12, cholesterol 129, triglycerides 91, HDL 50, LDL 61, hemoglobin A1c 7% April 2020: Hemoglobin A1c 8.8% TSH 2.05  Other Studies Reviewed Today:  Cardiac catheterization 06/24/2016:  Ost 3rd Diag to 3rd Diag lesion - jailed by stent in LAD, 55% stenosed. Angiographic similar to last catheterization  Widely patent mid LAD stent  Normal EF with normal EDP  Angiographically no significant difference in findings from last catheterization. L diagonal appears to be very similar to last catheterization. There could be some spasm of the slight, but this is not a good PCI target as it was not last time. The ostial nature of the lesion and size discrepancy between the diagonal and  the LAD makes it very difficult PCI target. Would only be able to do balloon angioplasty which would be suboptimal.  Assessment and Plan:  1.  CAD status post DES to the LAD with chronically jailed diagonal branch, asymptomatic in terms of recurring angina on medical therapy.  Continue aspirin, Lipitor, Imdur, and Lopressor.  2.  Mixed hyperlipidemia, she continues on high-dose Lipitor without side effects.  Requesting most recent lipid panel from Dr. Pleas Koch.  3.  Essential hypertension, systolic 161 today.  Continue Lopressor.  Weight loss would also be beneficial.  Medication Adjustments/Labs and Tests Ordered: Current medicines are reviewed at length with the patient today.  Concerns regarding medicines are outlined above.   Tests Ordered: No orders of the defined types were placed in this encounter.  Medication Changes: Meds ordered this encounter  Medications   nitroGLYCERIN (NITROSTAT) 0.4 MG SL tablet    Sig: Place 1 tablet (0.4 mg total) under the tongue every 5 (five) minutes x 3 doses as needed for chest pain (if no relief after 3rd dose, proceed to the ED for an evaluation or call 911). If no relief after 3rd dose, proceed to the ED for an evaluation    Dispense:  25 tablet    Refill:  3   isosorbide mononitrate (IMDUR) 60 MG 24 hr tablet    Sig: TAKE 1 TABLET BY MOUTH ONCE DAILY    Dispense:  90 tablet    Refill:  2    Please consider 90 day supplies to promote better adherence    Disposition:  Follow up 6 months in the [place] office.  Signed, Jonelle Sidle, MD, [facility] 05/15/2020 9:05 AM    [facility] at [facility] [address] [place], [place], [place] [address] Phone: [phone]; Fax: [phone]

## 2020-05-15 ENCOUNTER — Encounter: Payer: Self-pay | Admitting: *Deleted

## 2020-05-15 ENCOUNTER — Ambulatory Visit (INDEPENDENT_AMBULATORY_CARE_PROVIDER_SITE_OTHER): Payer: 59 | Admitting: Cardiology

## 2020-05-15 ENCOUNTER — Other Ambulatory Visit: Payer: Self-pay

## 2020-05-15 ENCOUNTER — Encounter: Payer: Self-pay | Admitting: Cardiology

## 2020-05-15 VITALS — BP 130/80 | HR 70 | Ht 64.0 in | Wt 283.0 lb

## 2020-05-15 DIAGNOSIS — I25119 Atherosclerotic heart disease of native coronary artery with unspecified angina pectoris: Secondary | ICD-10-CM

## 2020-05-15 DIAGNOSIS — E782 Mixed hyperlipidemia: Secondary | ICD-10-CM | POA: Diagnosis not present

## 2020-05-15 DIAGNOSIS — I10 Essential (primary) hypertension: Secondary | ICD-10-CM | POA: Diagnosis not present

## 2020-05-15 MED ORDER — NITROGLYCERIN 0.4 MG SL SUBL: 0.4000 mg | SUBLINGUAL_TABLET | SUBLINGUAL | 3 refills | Status: DC | PRN

## 2020-05-15 MED ORDER — ISOSORBIDE MONONITRATE ER 60 MG PO TB24: ORAL_TABLET | ORAL | 2 refills | Status: DC

## 2020-05-15 NOTE — Patient Instructions (Addendum)
Medication Instructions:    Your physician recommends that you continue on your current medications as directed. Please refer to the Current Medication list given to you today.  Labwork:  NONE  Testing/Procedures:  NONE  Follow-Up:  Your physician recommends that you schedule a follow-up appointment in: 6 months (office)  Any Other Special Instructions Will Be Listed Below (If Applicable).  If you need a refill on your cardiac medications before your next appointment, please call your pharmacy. 

## 2020-08-11 ENCOUNTER — Ambulatory Visit (INDEPENDENT_AMBULATORY_CARE_PROVIDER_SITE_OTHER): Payer: 59 | Admitting: Gastroenterology

## 2020-12-04 NOTE — Progress Notes (Signed)
Cardiology Office Note  Date: 12/05/2020   ID: Andrea, Hughes January 31, 1956, MRN [id number]  PCP:  Juliette Alcide, MD  Cardiologist:  Nona Dell, MD Electrophysiologist:  None   Chief Complaint  Patient presents with  . Follow-up    6 mth    History of Present Illness: Andrea Hughes is a 64 y.o. female last seen in May.  She presents for a routine visit.  Overall doing well with typical activities, no accelerating angina symptoms.  I reviewed her medications which are outlined below.  She states that she has been compliant with therapy, needs a refill on Lopressor.  She has not had to use any sublingual nitroglycerin.  I did review her lab work from earlier in the year, LDL was well controlled at 66.  She is still working on better blood glucose control, now on Jardiance.  I personally reviewed her ECG today which shows sinus rhythm with PVC, left anterior fascicular block, increased voltage.  Past Medical History:  Diagnosis Date  . CAD (coronary artery disease)    a. 10/18/2014 NSTEMI, DES to mLAD 10/21/2014 b. 10/23/2014 NSTEMI, cath with patent stent, moderate stenosis in D3 enzyme elevation felt secondary to spasm with mild plaque shift in the diagonal  . Depression   . Essential hypertension   . Hyperlipidemia   . Morbid obesity (HCC)   . NSTEMI (non-ST elevated myocardial infarction) [facility]) October 2015  . Type 2 diabetes mellitus (HCC)     Past Surgical History:  Procedure Laterality Date  . BACK SURGERY  1988  . BLADDER SUSPENSION    . BREAST SURGERY     1984  . CARDIAC CATHETERIZATION N/A 06/24/2016   Procedure: Left Heart Cath and Coronary Angiography;  Surgeon: Marykay Lex, MD;  Location: [facility] INVASIVE CV LAB;  Service: Cardiovascular;  Laterality: N/A;  . CHOLECYSTECTOMY  2004  . COLONOSCOPY  09/07/2011   Procedure: COLONOSCOPY;  Surgeon: Dalia Heading;  [facility];  Service: Gastroenterology;  Laterality: N/A;  . LEFT HEART  CATHETERIZATION WITH CORONARY ANGIOGRAM N/A 10/21/2014   Procedure: LEFT HEART CATHETERIZATION WITH CORONARY ANGIOGRAM;  Surgeon: Marykay Lex, MD;  Location: [facility] CATH LAB;  Service: Cardiovascular;  Laterality: N/A;  . LEFT HEART CATHETERIZATION WITH CORONARY ANGIOGRAM N/A 10/24/2014   Procedure: LEFT HEART CATHETERIZATION WITH CORONARY ANGIOGRAM;  Surgeon: Marykay Lex, MD;  Location: [facility] CATH LAB;  Service: Cardiovascular;  Laterality: N/A;  . TONSILLECTOMY     64 years old  . TUBAL LIGATION    . VAGINAL HYSTERECTOMY      Current Outpatient Medications  Medication Sig Dispense Refill  . aspirin EC 325 MG tablet Take 325 mg by mouth daily.    Marland Kitchen atorvastatin (LIPITOR) 80 MG tablet TAKE 1 TABLET BY MOUTH ONCE DAILY AT  6  PM 30 tablet 6  . clonazePAM (KLONOPIN) 1 MG tablet Take 1 mg by mouth as needed for anxiety.    . empagliflozin (JARDIANCE) 10 MG TABS tablet Take 10 mg by mouth daily.    Marland Kitchen escitalopram (LEXAPRO) 10 MG tablet Take 10 mg by mouth daily.    . fluticasone (FLONASE) 50 MCG/ACT nasal spray Place into both nostrils daily.    Marland Kitchen glipiZIDE (GLUCOTROL) 10 MG tablet Take 10 mg by mouth 2 (two) times daily before a meal.     . isosorbide mononitrate (IMDUR) 60 MG 24 hr tablet TAKE 1 TABLET BY MOUTH ONCE DAILY 90 tablet 2  .  losartan-hydrochlorothiazide (HYZAAR) 100-12.5 MG tablet Take 1 tablet by mouth daily.    . metFORMIN (GLUCOPHAGE XR) 500 MG 24 hr tablet Take 4 tablets (2,000 mg total) by mouth daily with breakfast. HOLD for 48 hours, restart on 10/27/2014. (Patient taking differently: Take 1,000 mg by mouth 2 (two) times daily. HOLD for 48 hours, restart on 10/27/2014.) 120 tablet 11  . nitroGLYCERIN (NITROSTAT) 0.4 MG SL tablet Place 1 tablet (0.4 mg total) under the tongue every 5 (five) minutes x 3 doses as needed for chest pain (if no relief after 3rd dose, proceed to the ED for an evaluation or call 911). If no relief after 3rd dose, proceed to the ED for an evaluation 25  tablet 3  . OMEPRAZOLE PO Take 40 mg by mouth daily.    . metoprolol tartrate (LOPRESSOR) 100 MG tablet Take 1 tablet (100 mg total) by mouth 2 (two) times daily. 180 tablet 3   No current facility-administered medications for this visit.   Allergies:  Contrast media [iodinated diagnostic agents], Sulfa drugs cross reactors, and Penicillins   ROS: No syncope.  Physical Exam: VS:  BP 122/82   Pulse 83   Resp 16   Ht 5\' 4"  (1.626 m)   Wt 282 lb 12.8 oz (128.3 kg)   SpO2 98%   BMI 48.54 kg/m , BMI Body mass index is 48.54 kg/m.  Wt Readings from Last 3 Encounters:  12/05/20 282 lb 12.8 oz (128.3 kg)  05/15/20 283 lb (128.4 kg)  11/06/19 296 lb (134.3 kg)    General: Patient appears comfortable at rest. HEENT: Conjunctiva and lids normal, wearing a mask. Neck: Supple, no elevated JVP or carotid bruits, no thyromegaly. Lungs: Clear to auscultation, nonlabored breathing at rest. Cardiac: Regular rate and rhythm, no S3 or significant systolic murmur, no pericardial rub. Extremities: No pitting edema.  ECG:  An ECG dated 11/06/2019 was personally reviewed today and demonstrated:  Sinus rhythm with poor R wave progression, lead motion artifact, nonspecific ST-T changes.  Recent Labwork:  February 2021: Cholesterol 141, triglycerides 137, HDL 51, LDL 66 April 2021: Hemoglobin A1c 9.8%, BUN 8, creatinine 0.68, potassium 3.8, AST 17, ALT 34  Other Studies Reviewed Today:  Cardiac catheterization 06/24/2016:  Ost 3rd Diag to 3rd Diag lesion - jailed by stent in LAD, 55% stenosed. Angiographic similar to last catheterization  Widely patent mid LAD stent  Normal EF with normal EDP  Angiographically no significant difference in findings from last catheterization. L diagonal appears to be very similar to last catheterization. There could be some spasm of the slight, but this is not a good PCI target as it was not last time. The ostial nature of the lesion and size discrepancy between  the diagonal and the LAD makes it very difficult PCI target. Would only be able to do balloon angioplasty which would be suboptimal.  Assessment and Plan:  1.  Status post DES to the LAD with chronically jailed diagonal branch.  She does have angina with highest level of activity, but otherwise doing well without any regular nitroglycerin use on medical therapy.  Continue aspirin, Lipitor, Imdur, Hyzaar, and Lopressor.  ECG reviewed.  2.  Hyperlipidemia, on Lipitor.  Last LDL 66.  3.  Essential hypertension, blood pressure is well controlled today.  No changes in current regimen.  Medication Adjustments/Labs and Tests Ordered: Current medicines are reviewed at length with the patient today.  Concerns regarding medicines are outlined above.   Tests Ordered: No orders of the  defined types were placed in this encounter.   Medication Changes: Meds ordered this encounter  Medications  . metoprolol tartrate (LOPRESSOR) 100 MG tablet    Sig: Take 1 tablet (100 mg total) by mouth 2 (two) times daily.    Dispense:  180 tablet    Refill:  3    Disposition:  Follow up 6 months in the [place] office.  Signed, Jonelle Sidle, MD, [facility] 12/05/2020 10:15 AM    [facility] [address] [place], [place], [place] [address] Phone: [phone]; Fax: [phone]

## 2020-12-05 ENCOUNTER — Other Ambulatory Visit: Payer: Self-pay

## 2020-12-05 ENCOUNTER — Encounter: Payer: Self-pay | Admitting: Cardiology

## 2020-12-05 ENCOUNTER — Ambulatory Visit (INDEPENDENT_AMBULATORY_CARE_PROVIDER_SITE_OTHER): Payer: 59 | Admitting: Cardiology

## 2020-12-05 VITALS — BP 122/82 | HR 83 | Resp 16 | Ht 64.0 in | Wt 282.8 lb

## 2020-12-05 DIAGNOSIS — E782 Mixed hyperlipidemia: Secondary | ICD-10-CM

## 2020-12-05 DIAGNOSIS — I10 Essential (primary) hypertension: Secondary | ICD-10-CM | POA: Diagnosis not present

## 2020-12-05 DIAGNOSIS — I25119 Atherosclerotic heart disease of native coronary artery with unspecified angina pectoris: Secondary | ICD-10-CM

## 2020-12-05 MED ORDER — METOPROLOL TARTRATE 100 MG PO TABS: 100.0000 mg | ORAL_TABLET | Freq: Two times a day (BID) | ORAL | 3 refills | Status: AC

## 2020-12-05 NOTE — Addendum Note (Signed)
Addended by: Burman Nieves T on: 12/05/2020 01:48 PM   Modules accepted: Orders

## 2020-12-05 NOTE — Patient Instructions (Addendum)

## 2021-04-24 ENCOUNTER — Encounter: Payer: Self-pay | Admitting: Cardiology

## 2021-05-04 ENCOUNTER — Other Ambulatory Visit: Payer: Self-pay | Admitting: Cardiology

## 2021-05-31 NOTE — Progress Notes (Signed)
Cardiology Office Note  Date: 06/01/2021   ID: Andrea Hughes, DOB Jan 22, 1956, MRN [id number]  PCP:  Juliette Alcide, MD  Cardiologist:  Nona Dell, MD Electrophysiologist:  None   Chief Complaint  Patient presents with  . Cardiac follow-up    History of Present Illness: Andrea Hughes is a 65 y.o. female last seen in December 2021.  She is here for a follow-up visit.  Reports no angina symptoms and NYHA class II dyspnea.  We discussed a walking plan, but she is limited by chronic hip pain.  I reviewed her medications which are noted below.  She does not report any recent nitroglycerin use.  We are requesting her most recent lab work from Dr. Leandrew Koyanagi.  Past Medical History:  Diagnosis Date  . CAD (coronary artery disease)    a. 10/18/2014 NSTEMI, DES to mLAD 10/21/2014 b. 10/23/2014 NSTEMI, cath with patent stent, moderate stenosis in D3 enzyme elevation felt secondary to spasm with mild plaque shift in the diagonal  . Depression   . Essential hypertension   . Hyperlipidemia   . Morbid obesity (HCC)   . NSTEMI (non-ST elevated myocardial infarction) [facility]) October 2015  . Type 2 diabetes mellitus (HCC)     Past Surgical History:  Procedure Laterality Date  . BACK SURGERY  1988  . BLADDER SUSPENSION    . BREAST SURGERY     1984  . CARDIAC CATHETERIZATION N/A 06/24/2016   Procedure: Left Heart Cath and Coronary Angiography;  Surgeon: Marykay Lex, MD;  Location: [facility] INVASIVE CV LAB;  Service: Cardiovascular;  Laterality: N/A;  . CHOLECYSTECTOMY  2004  . COLONOSCOPY  09/07/2011   Procedure: COLONOSCOPY;  Surgeon: Dalia Heading;  [facility];  Service: Gastroenterology;  Laterality: N/A;  . LEFT HEART CATHETERIZATION WITH CORONARY ANGIOGRAM N/A 10/21/2014   Procedure: LEFT HEART CATHETERIZATION WITH CORONARY ANGIOGRAM;  Surgeon: Marykay Lex, MD;  Location: [facility] CATH LAB;  Service: Cardiovascular;  Laterality: N/A;  . LEFT HEART CATHETERIZATION WITH  CORONARY ANGIOGRAM N/A 10/24/2014   Procedure: LEFT HEART CATHETERIZATION WITH CORONARY ANGIOGRAM;  Surgeon: Marykay Lex, MD;  Location: [facility] CATH LAB;  Service: Cardiovascular;  Laterality: N/A;  . TONSILLECTOMY     65 years old  . TUBAL LIGATION    . VAGINAL HYSTERECTOMY      Current Outpatient Medications  Medication Sig Dispense Refill  . aspirin EC 325 MG tablet Take 325 mg by mouth daily.    Marland Kitchen atorvastatin (LIPITOR) 80 MG tablet TAKE 1 TABLET BY MOUTH ONCE DAILY AT  6  PM 30 tablet 6  . clonazePAM (KLONOPIN) 1 MG tablet Take 1 mg by mouth as needed for anxiety.    . empagliflozin (JARDIANCE) 10 MG TABS tablet Take 10 mg by mouth daily.    Marland Kitchen escitalopram (LEXAPRO) 10 MG tablet Take 10 mg by mouth daily.    . fluticasone (FLONASE) 50 MCG/ACT nasal spray Place 1 spray into both nostrils as needed for allergies or rhinitis.    Marland Kitchen glipiZIDE (GLUCOTROL) 10 MG tablet Take 10 mg by mouth 2 (two) times daily before a meal.     . isosorbide mononitrate (IMDUR) 60 MG 24 hr tablet Take 1 tablet by mouth once daily 90 tablet 2  . losartan-hydrochlorothiazide (HYZAAR) 100-12.5 MG tablet Take 1 tablet by mouth daily.    . metFORMIN (GLUCOPHAGE XR) 500 MG 24 hr tablet Take 4 tablets (2,000 mg total) by mouth daily with breakfast. HOLD for  48 hours, restart on 10/27/2014. 120 tablet 11  . metoprolol tartrate (LOPRESSOR) 100 MG tablet Take 1 tablet (100 mg total) by mouth 2 (two) times daily. 180 tablet 3  . nitroGLYCERIN (NITROSTAT) 0.4 MG SL tablet Place 1 tablet (0.4 mg total) under the tongue every 5 (five) minutes x 3 doses as needed for chest pain (if no relief after 3rd dose, proceed to the ED for an evaluation or call 911). If no relief after 3rd dose, proceed to the ED for an evaluation 25 tablet 3  . omeprazole (PRILOSEC) 40 MG capsule Take 1 capsule by mouth daily.     No current facility-administered medications for this visit.   Allergies:  Contrast media [iodinated diagnostic agents],  Sulfa drugs cross reactors, and Penicillins   ROS: No palpitations or syncope.  Physical Exam: VS:  BP (!) 150/80   Pulse 64   Ht 5\' 4"  (1.626 m)   Wt 275 lb 6.4 oz (124.9 kg)   SpO2 98%   BMI 47.27 kg/m , BMI Body mass index is 47.27 kg/m.  Wt Readings from Last 3 Encounters:  06/01/21 275 lb 6.4 oz (124.9 kg)  12/05/20 282 lb 12.8 oz (128.3 kg)  05/15/20 283 lb (128.4 kg)    General: Patient appears comfortable at rest. HEENT: Conjunctiva and lids normal, wearing a mask. Neck: Supple, no elevated JVP or carotid bruits, no thyromegaly. Lungs: Clear to auscultation, nonlabored breathing at rest. Cardiac: Regular rate and rhythm, no S3 or significant systolic murmur, no pericardial rub. Extremities: No pitting edema.  ECG:  An ECG dated 12/05/2020 was personally reviewed today and demonstrated:  Sinus rhythm with PVC, left anterior fascicular block, increased voltage.  Recent Labwork:  February 2021: Cholesterol 141, triglycerides 137, HDL 51, LDL 66 April 2021: Hemoglobin A1c 9.8%, BUN 8, creatinine 0.68, potassium 3.8, AST 17, ALT 34  Other Studies Reviewed Today:  Cardiac catheterization 06/24/2016:  Ost 3rd Diag to 3rd Diag lesion - jailed by stent in LAD, 55% stenosed. Angiographic similar to last catheterization  Widely patent mid LAD stent  Normal EF with normal EDP  Angiographically no significant difference in findings from last catheterization. L diagonal appears to be very similar to last catheterization. There could be some spasm of the slight, but this is not a good PCI target as it was not last time. The ostial nature of the lesion and size discrepancy between the diagonal and the LAD makes it very difficult PCI target. Would only be able to do balloon angioplasty which would be suboptimal.  Assessment and Plan:  1.  CAD status post DES to the LAD with chronically jailed diagonal branch and no progressive angina on medical therapy.  Continue observation on  aspirin, Lipitor, Imdur, Hyzaar, Lopressor, and Jardiance.  2.  Mixed hyperlipidemia, continues on Lipitor.  Requesting interval lab work from Dr. 06/26/2016.  Medication Adjustments/Labs and Tests Ordered: Current medicines are reviewed at length with the patient today.  Concerns regarding medicines are outlined above.   Tests Ordered: No orders of the defined types were placed in this encounter.   Medication Changes: No orders of the defined types were placed in this encounter.   Disposition:  Follow up 6 months.  Signed, Leandrew Koyanagi, MD, [facility] 06/01/2021 10:16 AM    [facility] at [facility] [address] [place], [place], [place] Phone: [phone]; Fax: [phone]

## 2021-06-01 ENCOUNTER — Encounter: Payer: Self-pay | Admitting: Cardiology

## 2021-06-01 ENCOUNTER — Ambulatory Visit: Payer: 59 | Admitting: Cardiology

## 2021-06-01 ENCOUNTER — Encounter: Payer: Self-pay | Admitting: *Deleted

## 2021-06-01 VITALS — BP 150/80 | HR 64 | Ht 64.0 in | Wt 275.4 lb

## 2021-06-01 DIAGNOSIS — I25119 Atherosclerotic heart disease of native coronary artery with unspecified angina pectoris: Secondary | ICD-10-CM

## 2021-06-01 DIAGNOSIS — E782 Mixed hyperlipidemia: Secondary | ICD-10-CM

## 2021-06-01 NOTE — Patient Instructions (Addendum)

## 2021-07-21 ENCOUNTER — Other Ambulatory Visit: Payer: Self-pay

## 2021-07-21 ENCOUNTER — Ambulatory Visit: Payer: 59

## 2021-07-21 ENCOUNTER — Ambulatory Visit: Payer: 59 | Admitting: Orthopedic Surgery

## 2021-07-21 ENCOUNTER — Encounter: Payer: Self-pay | Admitting: Orthopedic Surgery

## 2021-07-21 VITALS — BP 158/96 | HR 64 | Ht 64.0 in | Wt 267.0 lb

## 2021-07-21 DIAGNOSIS — M25512 Pain in left shoulder: Secondary | ICD-10-CM

## 2021-07-21 DIAGNOSIS — M7582 Other shoulder lesions, left shoulder: Secondary | ICD-10-CM

## 2021-07-21 NOTE — Patient Instructions (Addendum)

## 2021-07-21 NOTE — Progress Notes (Signed)
New Patient Visit  Assessment: Andrea Hughes is a 65 y.o. RHD female with the following: Left rotator cuff tendonitis  Plan: We had extensive discussion in clinic today in regards to her left shoulder.  She has deficits in strength, as well as range of motion.  However overall, her function is pretty good.  The description of her pain, including the radiating pain, and worsening pain at night are most consistent with a rotator cuff injury.  This could be tendinitis based on her overall range of motion.  We discussed multiple treatment options including medications as needed, prescription medications, topical medications home exercise program versus formal physical therapy and the possibility of an injection.  After discussing this, and answering all her questions, she has decided proceed with a steroid injection.  This was completed in clinic today.  I also provided her with some home exercises to begin as soon as possible.  Follow-up as needed.  Procedure note injection Left shoulder    Verbal consent was obtained to inject the left shoulder, subacromial space Timeout was completed to confirm the site of injection.  The skin was prepped with alcohol and ethyl chloride was sprayed at the injection site.  A 21-gauge needle was used to inject 40 mg of Depo-Medrol and 1% lidocaine (3 cc) into the subacromial space of the left shoulder using a posterolateral approach.  There were no complications. A sterile bandage was applied.  Follow-up: Return if symptoms worsen or fail to improve.  Subjective:  Chief Complaint  Patient presents with   Shoulder Pain    Lt shoulder pain after fall Jan 22.     History of Present Illness: Andrea Hughes is a 65 y.o. RHD female who has been referred to clinic today by Reatha Harps, DC for evaluation of left shoulder pain.  She states she has been having ongoing pain in the anterior and lateral aspect of the left shoulder since a fall in January, 2022.   Initially, she had no pain in the shoulder, but a few days after falling, she noted progressive worsening.  Pain is in the anterior shoulder, as well as the lateral aspect of her shoulder.  Pain gets worse at night, which is most concerning to her at this time.  Pain also radiates down the anterior and lateral aspect of her upper arm.  She been taking over-the-counter pain medications with minimal improvement.  No physical therapy today.  She has not been doing any home exercise programs.  She has never had an injection of the left shoulder.   Review of Systems: No fevers or chills No numbness or tingling No chest pain No shortness of breath No bowel or bladder dysfunction No GI distress No headaches   Medical History:  Past Medical History:  Diagnosis Date   CAD (coronary artery disease)    a. 10/18/2014 NSTEMI, DES to mLAD 10/21/2014 b. 10/23/2014 NSTEMI, cath with patent stent, moderate stenosis in D3 enzyme elevation felt secondary to spasm with mild plaque shift in the diagonal   Depression    Essential hypertension    Hyperlipidemia    Morbid obesity (HCC)    NSTEMI (non-ST elevated myocardial infarction) [facility]) October 2015   Type 2 diabetes mellitus [facility])     Past Surgical History:  Procedure Laterality Date   BACK SURGERY  1988   BLADDER SUSPENSION     BREAST SURGERY     1984   CARDIAC CATHETERIZATION N/A 06/24/2016   Procedure: Left Heart Cath and  Coronary Angiography;  Surgeon: Marykay Lex, MD;  Location: [facility] INVASIVE CV LAB;  Service: Cardiovascular;  Laterality: N/A;   CHOLECYSTECTOMY  2004   COLONOSCOPY  09/07/2011   Procedure: COLONOSCOPY;  Surgeon: Dalia Heading;  [facility];  Service: Gastroenterology;  Laterality: N/A;   LEFT HEART CATHETERIZATION WITH CORONARY ANGIOGRAM N/A 10/21/2014   Procedure: LEFT HEART CATHETERIZATION WITH CORONARY ANGIOGRAM;  Surgeon: Marykay Lex, MD;  Location: [facility] CATH LAB;  Service: Cardiovascular;  Laterality: N/A;    LEFT HEART CATHETERIZATION WITH CORONARY ANGIOGRAM N/A 10/24/2014   Procedure: LEFT HEART CATHETERIZATION WITH CORONARY ANGIOGRAM;  Surgeon: Marykay Lex, MD;  Location: [facility] CATH LAB;  Service: Cardiovascular;  Laterality: N/A;   TONSILLECTOMY     65 years old   TUBAL LIGATION     VAGINAL HYSTERECTOMY      Family History  Problem Relation Age of Onset   Cancer Father        Lung   Heart disease Mother        Valve replacement    Breast cancer Maternal Aunt    Breast cancer Cousin    Breast cancer Maternal Aunt    Breast cancer Cousin    Breast cancer Cousin    Social History   Tobacco Use   Smoking status: Never   Smokeless tobacco: Never  Substance Use Topics   Alcohol use: No    Alcohol/week: 0.0 standard drinks   Drug use: No    Allergies  Allergen Reactions   Contrast Media [Iodinated Diagnostic Agents] Itching and Swelling   Sulfa Drugs Cross Reactors Swelling   Penicillins Rash    Current Meds  Medication Sig   aspirin EC 325 MG tablet Take 325 mg by mouth daily.   atorvastatin (LIPITOR) 80 MG tablet TAKE 1 TABLET BY MOUTH ONCE DAILY AT  6  PM   clonazePAM (KLONOPIN) 1 MG tablet Take 1 mg by mouth as needed for anxiety.   empagliflozin (JARDIANCE) 10 MG TABS tablet Take 10 mg by mouth daily.   escitalopram (LEXAPRO) 10 MG tablet Take 10 mg by mouth daily.   fluticasone (FLONASE) 50 MCG/ACT nasal spray Place 1 spray into both nostrils as needed for allergies or rhinitis.   glipiZIDE (GLUCOTROL) 10 MG tablet Take 10 mg by mouth 2 (two) times daily before a meal.    isosorbide mononitrate (IMDUR) 60 MG 24 hr tablet Take 1 tablet by mouth once daily   losartan-hydrochlorothiazide (HYZAAR) 100-12.5 MG tablet Take 1 tablet by mouth daily.   metFORMIN (GLUCOPHAGE XR) 500 MG 24 hr tablet Take 4 tablets (2,000 mg total) by mouth daily with breakfast. HOLD for 48 hours, restart on 10/27/2014.   metoprolol tartrate (LOPRESSOR) 100 MG tablet Take 1 tablet (100 mg  total) by mouth 2 (two) times daily.   nitroGLYCERIN (NITROSTAT) 0.4 MG SL tablet Place 1 tablet (0.4 mg total) under the tongue every 5 (five) minutes x 3 doses as needed for chest pain (if no relief after 3rd dose, proceed to the ED for an evaluation or call 911). If no relief after 3rd dose, proceed to the ED for an evaluation   omeprazole (PRILOSEC) 40 MG capsule Take 1 capsule by mouth daily.    Objective: BP (!) 158/96   Pulse 64   Ht 5\' 4"  (1.626 m)   Wt 267 lb (121.1 kg)   BMI 45.83 kg/m   Physical Exam:  General: Alert and oriented. and No acute distress. Gait: Normal  gait.  Evaluation left shoulder demonstrates no deformity.  No atrophy is appreciated.  She has slightly restricted range of motion in the left shoulder compared to the right shoulder.  Approximately 150 degrees of forward flexion.  85 degrees of abduction.  External rotation at her side to 30 degrees, compared to 45 on the contralateral side.  Internal rotation of the lumbar spine, but she is very hesitant due to discomfort.   Negative belly press.  4+/5 strength in supraspinatus and infraspinatus testing.  Positive O'Brien's testing.  Tenderness palpation over the bicipital groove.  IMAGING: I personally ordered and reviewed the following images  X-ray of the left shoulder was obtained in clinic today and demonstrates no acute injuries.  Glenohumeral joint is reduced.  Well-maintained glenohumeral joint space.  There is no proximal humeral migration.  Impression: Normal left shoulder x-ray   New Medications:  No orders of the defined types were placed in this encounter.     Oliver Barre, MD  07/21/2021 6:47 PM

## 2021-09-21 ENCOUNTER — Telehealth: Payer: Self-pay | Admitting: Orthopedic Surgery

## 2021-09-21 NOTE — Telephone Encounter (Signed)
Returned call to patient per voice message - reached voice mail, left message.

## 2021-09-22 ENCOUNTER — Other Ambulatory Visit: Payer: Self-pay

## 2021-09-22 ENCOUNTER — Encounter: Payer: Self-pay | Admitting: Orthopedic Surgery

## 2021-09-22 ENCOUNTER — Ambulatory Visit (INDEPENDENT_AMBULATORY_CARE_PROVIDER_SITE_OTHER): Payer: 59 | Admitting: Orthopedic Surgery

## 2021-09-22 VITALS — BP 151/87 | HR 69 | Ht 64.0 in | Wt 264.0 lb

## 2021-09-22 DIAGNOSIS — M7582 Other shoulder lesions, left shoulder: Secondary | ICD-10-CM

## 2021-09-22 MED ORDER — CYCLOBENZAPRINE HCL 5 MG PO TABS: 10.0000 mg | ORAL_TABLET | Freq: Two times a day (BID) | ORAL | 0 refills | Status: DC | PRN

## 2021-09-22 NOTE — Progress Notes (Signed)
Orthopaedic Clinic Return  Assessment: Andrea Hughes is a 65 y.o. female with the following: Left shoulder pain, concern for rotator cuff injury  Plan: Patient reports that she did not have any improvement of her pain following the most recent injection.  She continues to have pain in the anterior left shoulder, which radiates distally.  She has limited function as result.  I am concerned that she sustained an injury to the rotator cuff tendons, however she cannot have an MRI due to a metal cardiac stent.  As a result, we will place an order for an ultrasound of the left shoulder.  We discussed the limitation of the ultrasound, and direct comparison to an MRI.  Pending the results of the ultrasound, we may have to discuss surgery.  Once again, we briefly discussed the limitations, and if we proceed with surgery, this could potentially be a diagnostic procedure versus repair of the rotator cuff tear, should edema to be chronic or she has significant arthritis.  She stated understanding.  We will discuss this once again, once we have the results of the ultrasound.  Meds ordered this encounter  Medications   cyclobenzaprine (FLEXERIL) 5 MG tablet    Sig: Take 2 tablets (10 mg total) by mouth 2 (two) times daily as needed for muscle spasms.    Dispense:  20 tablet    Refill:  0    Body mass index is 45.32 kg/m.  Follow-up: Return for After imaging of your shoulder.   Subjective:  Chief Complaint  Patient presents with   Shoulder Pain    Lt shoulder, doesn't feel shot helped at all pain. Pain was really bad this past weekend.     History of Present Illness: Andrea Hughes is a 65 y.o. female who returns to clinic for repeat evaluation of left shoulder.  She was seen in clinic a little over 2 months ago.  She had an injection of the left shoulder at that time.  Injection did not improve her pain, noting for over 1 week.  She continues to have pain in the anterior aspect of the left  shoulder.  Pain does radiate distally.  She also has some pain and stiffness to the left side of her neck.  She is unable to complete overhead tasks.  She has difficulty getting her arm to the level of her shoulder.  She is taking over-the-counter pain medications.  Review of Systems: No fevers or chills No numbness or tingling No chest pain No shortness of breath No bowel or bladder dysfunction No GI distress No headaches   Objective: BP (!) 151/87   Pulse 69   Ht 5\' 4"  (1.626 m)   Wt 264 lb (119.7 kg)   BMI 45.32 kg/m   Physical Exam:  Oriented.  No acute distress.  Evaluation of the left shoulder demonstrates no deformity.  Tenderness palpation over the anterior shoulder.  Forward flexion is limited to approximately 80 degrees.  Abduction limited to 80 degrees at her side.  She has difficulty with internal rotation to the lumbar spine.  Fingers warm and well-perfused.  IMAGING: I personally ordered and reviewed the following images:  No new imaging obtained today.  , MD 09/22/2021 12:03 PM

## 2021-09-22 NOTE — Telephone Encounter (Signed)
Done

## 2021-10-07 ENCOUNTER — Ambulatory Visit
Admission: RE | Admit: 2021-10-07 | Discharge: 2021-10-07 | Disposition: A | Discharge status: Home / Self Care | Payer: 59 | Source: Ambulatory Visit | Attending: Orthopedic Surgery | Admitting: Orthopedic Surgery

## 2021-10-07 DIAGNOSIS — M7582 Other shoulder lesions, left shoulder: Secondary | ICD-10-CM

## 2021-10-14 ENCOUNTER — Ambulatory Visit (INDEPENDENT_AMBULATORY_CARE_PROVIDER_SITE_OTHER): Payer: 59 | Admitting: Orthopedic Surgery

## 2021-10-14 ENCOUNTER — Encounter: Payer: Self-pay | Admitting: Orthopedic Surgery

## 2021-10-14 ENCOUNTER — Other Ambulatory Visit: Payer: Self-pay

## 2021-10-14 VITALS — BP 141/81 | HR 74 | Ht 64.0 in | Wt 264.0 lb

## 2021-10-14 DIAGNOSIS — M7582 Other shoulder lesions, left shoulder: Secondary | ICD-10-CM

## 2021-10-14 NOTE — Progress Notes (Signed)
Orthopaedic Clinic Return  Assessment: Andrea Hughes is a 65 y.o. female with the following: Left shoulder pain, current presentation most consistent with adhesive capsulitis  Plan: Patient reports that she is doing better overall.  Patient cannot undergo an MRI, and thus presents following her ultrasound of the left shoulder.  This is without a full-thickness tear.  Ultrasound demonstrated thickening of the supraspinatus tendon.  There is no operative intervention based on the ultrasound.  Her pain is improving, and she is avoiding certain conditions.  Previous injection provided minimal relief, but we have not tried a glenohumeral joint injection.  If she has any further issues, this would be the next reasonable step.  She stated understanding.  Follow-up as needed.  The patient meets the AMA guidelines for Morbid obesity with BMI > 40.  The patient has been counseled on weight loss.    Follow-up: Return if symptoms worsen or fail to improve.   Subjective:  Chief Complaint  Patient presents with   Results    [place] Left shoulder Joint Space    History of Present Illness: Andrea Hughes is a 65 y.o. female who returns to clinic for repeat evaluation of her left shoulder.  We attempted a subacromial injection at the last visit, which provided limited improvement of her pain.  She cannot undergo an MRI, and so we obtained an ultrasound of the left shoulder.  She returns today to review the results.  Overall, her pain is better.  She is avoiding certain activities.  At this time, she states that her pain is much better, and she would not be interested in surgery.  Review of Systems: No fevers or chills No numbness or tingling No chest pain No shortness of breath No bowel or bladder dysfunction No GI distress No headaches   Objective: BP (!) 141/81   Pulse 74   Ht 5\' 4"  (1.626 m)   Wt 264 lb (119.7 kg)   BMI 45.32 kg/m   Physical Exam:  Alert and oriented.  No acute  distress.  Evaluation left shoulder demonstrates no deformity.  Forward flexion limited to 90 degrees.  Abduction limited to 80 degrees.  She is able to get to her lumbar spine, but this is painful.  Limited external rotation due to pain.  IMAGING: I personally ordered and reviewed the following images:  Left shoulder ultrasound  IMPRESSION: Tendinosis of the distal supraspinatus tendon. No evidence of full-thickness cuff tear.   Intact subscapularis tendon. Poorly visualized infraspinatus tendon.   Intact extra-articular long head biceps tendon located within the bicipital groove.   , MD 10/14/2021 10:17 PM

## 2021-12-01 ENCOUNTER — Encounter: Payer: Self-pay | Admitting: *Deleted

## 2021-12-01 ENCOUNTER — Encounter: Payer: Self-pay | Admitting: Cardiology

## 2021-12-01 ENCOUNTER — Ambulatory Visit (INDEPENDENT_AMBULATORY_CARE_PROVIDER_SITE_OTHER): Payer: 59 | Admitting: Cardiology

## 2021-12-01 VITALS — BP 134/76 | HR 64 | Ht 64.0 in | Wt 265.6 lb

## 2021-12-01 DIAGNOSIS — E782 Mixed hyperlipidemia: Secondary | ICD-10-CM | POA: Diagnosis not present

## 2021-12-01 DIAGNOSIS — I25119 Atherosclerotic heart disease of native coronary artery with unspecified angina pectoris: Secondary | ICD-10-CM | POA: Diagnosis not present

## 2021-12-01 MED ORDER — ASPIRIN EC 81 MG PO TBEC: 81.0000 mg | DELAYED_RELEASE_TABLET | Freq: Every day | ORAL | 3 refills | Status: AC

## 2021-12-01 NOTE — Patient Instructions (Addendum)
Medication Instructions:  °Your physician has recommended you make the following change in your medication:  °Decrease aspirin to 81 mg daily °Continue other medications the same ° °Labwork: °none ° °Testing/Procedures: °none ° °Follow-Up: °Your physician recommends that you schedule a follow-up appointment in: 6 months ° °Any Other Special Instructions Will Be Listed Below (If Applicable). ° °If you need a refill on your cardiac medications before your next appointment, please call your pharmacy. °

## 2021-12-01 NOTE — Progress Notes (Signed)
Cardiology Office Note  Date: 12/01/2021   ID: Dezi, Brauner Oct 07, 1956, MRN [id number]  PCP:  Juliette Alcide, MD  Cardiologist:  Nona Dell, MD Electrophysiologist:  None   Chief Complaint  Patient presents with   Cardiac follow-up     History of Present Illness: Andrea Hughes is a 65 y.o. female last seen in June.  She is here for a routine follow-up visit.  Continues to do well without active angina or nitroglycerin use.  Reports NYHA class II dyspnea with typical activities, no palpitations or syncope.  I reviewed her medications.  She had to stop Jardiance due to recurrent yeast infections.  Otherwise regimen is stable, we did discuss reducing aspirin to 81 mg daily.  I personally reviewed her ECG today which shows sinus rhythm with leftward axis, increased voltage, and poor R wave progression.  Past Medical History:  Diagnosis Date   CAD (coronary artery disease)    a. 10/18/2014 NSTEMI, DES to mLAD 10/21/2014 b. 10/23/2014 NSTEMI, cath with patent stent, moderate stenosis in D3 enzyme elevation felt secondary to spasm with mild plaque shift in the diagonal   Depression    Essential hypertension    Hyperlipidemia    Morbid obesity (HCC)    NSTEMI (non-ST elevated myocardial infarction) [facility]) October 2015   Type 2 diabetes mellitus [facility])     Past Surgical History:  Procedure Laterality Date   BACK SURGERY  1988   BLADDER SUSPENSION     BREAST SURGERY     1984   CARDIAC CATHETERIZATION N/A 06/24/2016   Procedure: Left Heart Cath and Coronary Angiography;  Surgeon: Marykay Lex, MD;  Location: [facility] INVASIVE CV LAB;  Service: Cardiovascular;  Laterality: N/A;   CHOLECYSTECTOMY  2004   COLONOSCOPY  09/07/2011   Procedure: COLONOSCOPY;  Surgeon: Dalia Heading;  [facility];  Service: Gastroenterology;  Laterality: N/A;   LEFT HEART CATHETERIZATION WITH CORONARY ANGIOGRAM N/A 10/21/2014   Procedure: LEFT HEART CATHETERIZATION WITH  CORONARY ANGIOGRAM;  Surgeon: Marykay Lex, MD;  Location: [facility] CATH LAB;  Service: Cardiovascular;  Laterality: N/A;   LEFT HEART CATHETERIZATION WITH CORONARY ANGIOGRAM N/A 10/24/2014   Procedure: LEFT HEART CATHETERIZATION WITH CORONARY ANGIOGRAM;  Surgeon: Marykay Lex, MD;  Location: [facility] CATH LAB;  Service: Cardiovascular;  Laterality: N/A;   TONSILLECTOMY     65 years old   TUBAL LIGATION     VAGINAL HYSTERECTOMY      Current Outpatient Medications  Medication Sig Dispense Refill   aspirin EC 81 MG tablet Take 1 tablet (81 mg total) by mouth daily. Swallow whole. 90 tablet 3   atorvastatin (LIPITOR) 80 MG tablet TAKE 1 TABLET BY MOUTH ONCE DAILY AT  6  PM 30 tablet 6   clonazePAM (KLONOPIN) 1 MG tablet Take 1 mg by mouth as needed for anxiety.     cyclobenzaprine (FLEXERIL) 5 MG tablet Take 2 tablets (10 mg total) by mouth 2 (two) times daily as needed for muscle spasms. 20 tablet 0   escitalopram (LEXAPRO) 10 MG tablet Take 10 mg by mouth daily.     fluticasone (FLONASE) 50 MCG/ACT nasal spray Place 1 spray into both nostrils as needed for allergies or rhinitis.     glipiZIDE (GLUCOTROL) 10 MG tablet Take 10 mg by mouth 2 (two) times daily before a meal.      isosorbide mononitrate (IMDUR) 60 MG 24 hr tablet Take 1 tablet by mouth once daily 90 tablet  2   losartan-hydrochlorothiazide (HYZAAR) 100-12.5 MG tablet Take 1 tablet by mouth daily.     metFORMIN (GLUCOPHAGE XR) 500 MG 24 hr tablet Take 4 tablets (2,000 mg total) by mouth daily with breakfast. HOLD for 48 hours, restart on 10/27/2014. 120 tablet 11   metoprolol tartrate (LOPRESSOR) 100 MG tablet Take 1 tablet (100 mg total) by mouth 2 (two) times daily. 180 tablet 3   nitroGLYCERIN (NITROSTAT) 0.4 MG SL tablet Place 1 tablet (0.4 mg total) under the tongue every 5 (five) minutes x 3 doses as needed for chest pain (if no relief after 3rd dose, proceed to the ED for an evaluation or call 911). If no relief after 3rd dose,  proceed to the ED for an evaluation 25 tablet 3   omeprazole (PRILOSEC) 40 MG capsule Take 1 capsule by mouth daily.     No current facility-administered medications for this visit.   Allergies:  Contrast media [iodinated diagnostic agents], Sulfa drugs cross reactors, and Penicillins   ROS: No orthopnea or PND.  Physical Exam: VS:  BP 134/76   Pulse 64   Ht 5\' 4"  (1.626 m)   Wt 265 lb 9.6 oz (120.5 kg)   SpO2 96%   BMI 45.59 kg/m , BMI Body mass index is 45.59 kg/m.  Wt Readings from Last 3 Encounters:  12/01/21 265 lb 9.6 oz (120.5 kg)  10/14/21 264 lb (119.7 kg)  09/22/21 264 lb (119.7 kg)    General: Patient appears comfortable at rest. HEENT: Conjunctiva and lids normal, wearing a mask. Neck: Supple, no elevated JVP or carotid bruits, no thyromegaly. Lungs: Clear to auscultation, nonlabored breathing at rest. Cardiac: Regular rate and rhythm, no S3 or significant systolic murmur. Extremities: No pitting edema.  ECG:  An ECG dated 12/05/2020 was personally reviewed today and demonstrated:  Sinus rhythm with PVC, left anterior fascicular block, increased voltage.  Recent Labwork:  April 2022: BUN 16, creatinine 0.72, potassium 4.1, AST 14, ALT 17, hemoglobin A1c 9%  Other Studies Reviewed Today:  No interval cardiac testing for review.  Assessment and Plan:  1.  CAD status post DES to the LAD with chronically jailed diagonal branch.  She is doing well without active angina, ECG reviewed.  Reduce aspirin to 81 mg daily.  Continue Lipitor, Imdur, Lopressor, and Hyzaar.  She is no longer on Jardiance due to recurrent yeast infections.  2.  Hyperlipidemia, continues on Lipitor with follow-up by Dr. May 2022.  Requesting interval lab work.  Medication Adjustments/Labs and Tests Ordered: Current medicines are reviewed at length with the patient today.  Concerns regarding medicines are outlined above.   Tests Ordered: Orders Placed This Encounter  Procedures   EKG  12-Lead    Medication Changes: Meds ordered this encounter  Medications   aspirin EC 81 MG tablet    Sig: Take 1 tablet (81 mg total) by mouth daily. Swallow whole.    Dispense:  90 tablet    Refill:  3    12/01/2021 dose decrease     Disposition:  Follow up  6 months.  Signed, 14/05/2021, MD, [facility] 12/01/2021 10:37 AM    [facility] [address] [place], [place], [place] Phone: [phone]; Fax: [phone]

## 2021-12-03 ENCOUNTER — Other Ambulatory Visit: Payer: Self-pay

## 2021-12-03 ENCOUNTER — Encounter: Payer: Self-pay | Admitting: General Surgery

## 2021-12-03 ENCOUNTER — Ambulatory Visit (INDEPENDENT_AMBULATORY_CARE_PROVIDER_SITE_OTHER): Payer: 59 | Admitting: General Surgery

## 2021-12-03 VITALS — BP 120/81 | HR 72 | Temp 97.3°F | Resp 16 | Ht 64.0 in | Wt 266.0 lb

## 2021-12-03 DIAGNOSIS — Z1211 Encounter for screening for malignant neoplasm of colon: Secondary | ICD-10-CM

## 2021-12-03 MED ORDER — SUTAB 1479-225-188 MG PO TABS: 1.0000 | ORAL_TABLET | Freq: Once | ORAL | 0 refills | Status: AC

## 2021-12-04 NOTE — H&P (Signed)
Andrea Hughes [place]; [id number]; 1956/06/07   HPI Patient is a 65 year old white female who was referred to my care by Dr. Quintin Alto for a screening colonoscopy.  Patient last had a screening colonoscopy over 10 years ago.  She denies any immediate family history of colon cancer, blood per rectum, significant weight loss, abnormal diarrhea or constipation. Past Medical History:  Diagnosis Date   CAD (coronary artery disease)    a. 10/18/2014 NSTEMI, DES to mLAD 10/21/2014 b. 10/23/2014 NSTEMI, cath with patent stent, moderate stenosis in D3 enzyme elevation felt secondary to spasm with mild plaque shift in the diagonal   Depression    Essential hypertension    Hyperlipidemia    Morbid obesity (HCC)    NSTEMI (non-ST elevated myocardial infarction) [facility]) October 2015   Type 2 diabetes mellitus [facility])     Past Surgical History:  Procedure Laterality Date   BACK SURGERY  1988   BLADDER SUSPENSION     BREAST SURGERY     1984   CARDIAC CATHETERIZATION N/A 06/24/2016   Procedure: Left Heart Cath and Coronary Angiography;  Surgeon: Marykay Lex, MD;  Location: [facility] INVASIVE CV LAB;  Service: Cardiovascular;  Laterality: N/A;   CHOLECYSTECTOMY  2004   COLONOSCOPY  09/07/2011   Procedure: COLONOSCOPY;  Surgeon: Dalia Heading;  [facility];  Service: Gastroenterology;  Laterality: N/A;   LEFT HEART CATHETERIZATION WITH CORONARY ANGIOGRAM N/A 10/21/2014   Procedure: LEFT HEART CATHETERIZATION WITH CORONARY ANGIOGRAM;  Surgeon: Marykay Lex, MD;  Location[facility] CATH LAB;  Service: Cardiovascular;  Laterality: N/A;   LEFT HEART CATHETERIZATION WITH CORONARY ANGIOGRAM N/A 10/24/2014   Procedure: LEFT HEART CATHETERIZATION WITH CORONARY ANGIOGRAM;  Surgeon: Marykay Lex, MD;  Location: [facility] CATH LAB;  Service: Cardiovascular;  Laterality: N/A;   TONSILLECTOMY     65 years old   TUBAL LIGATION     VAGINAL HYSTERECTOMY      Family History  Problem Relation Age of Onset   Cancer  Father        Lung   Heart disease Mother        Valve replacement    Breast cancer Maternal Aunt    Breast cancer Cousin    Breast cancer Maternal Aunt    Breast cancer Cousin    Breast cancer Cousin     Current Outpatient Medications on File Prior to Visit  Medication Sig Dispense Refill   aspirin EC 81 MG tablet Take 1 tablet (81 mg total) by mouth daily. Swallow whole. 90 tablet 3   atorvastatin (LIPITOR) 80 MG tablet TAKE 1 TABLET BY MOUTH ONCE DAILY AT  6  PM 30 tablet 6   clonazePAM (KLONOPIN) 1 MG tablet Take 1 mg by mouth as needed for anxiety.     cyclobenzaprine (FLEXERIL) 5 MG tablet Take 2 tablets (10 mg total) by mouth 2 (two) times daily as needed for muscle spasms. 20 tablet 0   escitalopram (LEXAPRO) 10 MG tablet Take 10 mg by mouth daily.     fluticasone (FLONASE) 50 MCG/ACT nasal spray Place 1 spray into both nostrils as needed for allergies or rhinitis.     glipiZIDE (GLUCOTROL) 10 MG tablet Take 10 mg by mouth 2 (two) times daily before a meal.      isosorbide mononitrate (IMDUR) 60 MG 24 hr tablet Take 1 tablet by mouth once daily 90 tablet 2   losartan-hydrochlorothiazide (HYZAAR) 100-12.5 MG tablet Take 1 tablet by mouth daily.  metFORMIN (GLUCOPHAGE XR) 500 MG 24 hr tablet Take 4 tablets (2,000 mg total) by mouth daily with breakfast. HOLD for 48 hours, restart on 10/27/2014. 120 tablet 11   metoprolol tartrate (LOPRESSOR) 100 MG tablet Take 1 tablet (100 mg total) by mouth 2 (two) times daily. 180 tablet 3   nitroGLYCERIN (NITROSTAT) 0.4 MG SL tablet Place 1 tablet (0.4 mg total) under the tongue every 5 (five) minutes x 3 doses as needed for chest pain (if no relief after 3rd dose, proceed to the ED for an evaluation or call 911). If no relief after 3rd dose, proceed to the ED for an evaluation 25 tablet 3   omeprazole (PRILOSEC) 40 MG capsule Take 1 capsule by mouth daily.     No current facility-administered medications on file prior to visit.     Allergies  Allergen Reactions   Contrast Media [Iodinated Diagnostic Agents] Itching and Swelling   Sulfa Drugs Cross Reactors Swelling   Penicillins Rash    Social History   Substance and Sexual Activity  Alcohol Use No   Alcohol/week: 0.0 standard drinks    Social History   Tobacco Use  Smoking Status Never  Smokeless Tobacco Never    Review of Systems  Constitutional: Negative.   HENT:  Positive for sinus pain.   Eyes: Negative.   Respiratory: Negative.    Cardiovascular: Negative.   Gastrointestinal:  Positive for heartburn.  Genitourinary:  Positive for frequency.  Musculoskeletal: Negative.   Skin:  Positive for rash.  Neurological: Negative.   Endo/Heme/Allergies: Negative.   Psychiatric/Behavioral: Negative.     Objective   Vitals:   12/03/21 1023  BP: 120/81  Pulse: 72  Resp: 16  Temp: (!) 97.3 F (36.3 C)  SpO2: 96%    Physical Exam Vitals reviewed.  Constitutional:      Appearance: Normal appearance. She is obese. She is not ill-appearing.  HENT:     Head: Normocephalic and atraumatic.  Cardiovascular:     Rate and Rhythm: Normal rate and regular rhythm.     Heart sounds: Normal heart sounds. No murmur heard.   No friction rub. No gallop.  Pulmonary:     Effort: Pulmonary effort is normal. No respiratory distress.     Breath sounds: Normal breath sounds. No stridor. No wheezing, rhonchi or rales.  Abdominal:     General: There is no distension.     Palpations: Abdomen is soft. There is no mass.     Tenderness: There is no abdominal tenderness. There is no guarding or rebound.     Hernia: No hernia is present.  Skin:    General: Skin is warm and dry.  Neurological:     Mental Status: She is alert and oriented to person, place, and time.   Primary care notes reviewed Assessment  Need for screening colonoscopy Plan  Patient is scheduled for screening colonoscopy on 12/10/2021.  The risks and benefits of the procedure including  bleeding and perforation were fully explained to the patient, who gave informed consent.  Sutabs have been prescribed.

## 2021-12-04 NOTE — Patient Instructions (Signed)
TANNY HARNACK  12/04/2021     @PREFPERIOPPHARMACY @   Your procedure is scheduled on  12/10/2021.   Report to 12/12/2021 at  [phone] A.M.   Call this number if you have problems the morning of surgery:  [phone]   Remember:  Follow the diet and prep instructions given to you by Dr [phone].    Take these medicines the morning of surgery with A SIP OF WATER        clonazepam (if needed), flexeril(if needed), lexapro, isosorbide, metoprolol, omeprazole.     Do not wear jewelry, make-up or nail polish.  Do not wear lotions, powders, or perfumes, or deodorant.  Do not shave 48 hours prior to surgery.  Men may shave face and neck.  Do not bring valuables to the hospital.  [facility] is not responsible for any belongings or valuables.  Contacts, dentures or bridgework may not be worn into surgery.  Leave your suitcase in the car.  After surgery it may be brought to your room.  For patients admitted to the hospital, discharge time will be determined by your treatment team.  Patients discharged the day of surgery will not be allowed to drive home and must have someone with them for 24 hours.    Special instructions:   DO NOT smoke tobacco or vape for 24 hours before your procedure.  Please read over the following fact sheets that you were given. Anesthesia Post-op Instructions and Care and Recovery After Surgery      Colonoscopy, Adult, Care After This sheet gives you information about how to care for yourself after your procedure. Your health care provider may also give you more specific instructions. If you have problems or questions, contact your health care provider. What can I expect after the procedure? After the procedure, it is common to have: A small amount of blood in your stool for 24 hours after the procedure. Some gas. Mild cramping or bloating of your abdomen. Follow these instructions at home: Eating and drinking  Drink enough fluid to keep your  urine pale yellow. Follow instructions from your health care provider about eating or drinking restrictions. Resume your normal diet as instructed by your health care provider. Avoid heavy or fried foods that are hard to digest. Activity Rest as told by your health care provider. Avoid sitting for a long time without moving. Get up to take short walks every 1-2 hours. This is important to improve blood flow and breathing. Ask for help if you feel weak or unsteady. Return to your normal activities as told by your health care provider. Ask your health care provider what activities are safe for you. Managing cramping and bloating  Try walking around when you have cramps or feel bloated. Apply heat to your abdomen as told by your health care provider. Use the heat source that your health care provider recommends, such as a moist heat pack or a heating pad. Place a towel between your skin and the heat source. Leave the heat on for 20-30 minutes. Remove the heat if your skin turns bright red. This is especially important if you are unable to feel pain, heat, or cold. You may have a greater risk of getting burned. General instructions If you were given a sedative during the procedure, it can affect you for several hours. Do not drive or operate machinery until your health care provider says that it is safe. For the first 24 hours after the  procedure: Do not sign important documents. Do not drink alcohol. Do your regular daily activities at a slower pace than normal. Eat soft foods that are easy to digest. Take over-the-counter and prescription medicines only as told by your health care provider. Keep all follow-up visits as told by your health care provider. This is important. Contact a health care provider if: You have blood in your stool 2-3 days after the procedure. Get help right away if you have: More than a small spotting of blood in your stool. Large blood clots in your stool. Swelling of  your abdomen. Nausea or vomiting. A fever. Increasing pain in your abdomen that is not relieved with medicine. Summary After the procedure, it is common to have a small amount of blood in your stool. You may also have mild cramping and bloating of your abdomen. If you were given a sedative during the procedure, it can affect you for several hours. Do not drive or operate machinery until your health care provider says that it is safe. Get help right away if you have a lot of blood in your stool, nausea or vomiting, a fever, or increased pain in your abdomen. This information is not intended to replace advice given to you by your health care provider. Make sure you discuss any questions you have with your health care provider. Document Revised: 10/19/2019 Document Reviewed: 07/09/2019 Elsevier Patient Education  2022 [employer]. Monitored Anesthesia Care, Care After This sheet gives you information about how to care for yourself after your procedure. Your health care provider may also give you more specific instructions. If you have problems or questions, contact your health care provider. What can I expect after the procedure? After the procedure, it is common to have: Tiredness. Forgetfulness about what happened after the procedure. Impaired judgment for important decisions. Nausea or vomiting. Some difficulty with balance. Follow these instructions at home: For the time period you were told by your health care provider:   Rest as needed. Do not participate in activities where you could fall or become injured. Do not drive or use machinery. Do not drink alcohol. Do not take sleeping pills or medicines that cause drowsiness. Do not make important decisions or sign legal documents. Do not take care of children on your own. Eating and drinking Follow the diet that is recommended by your health care provider. Drink enough fluid to keep your urine pale yellow. If you vomit: Drink  water, juice, or soup when you can drink without vomiting. Make sure you have little or no nausea before eating solid foods. General instructions Have a responsible adult stay with you for the time you are told. It is important to have someone help care for you until you are awake and alert. Take over-the-counter and prescription medicines only as told by your health care provider. If you have sleep apnea, surgery and certain medicines can increase your risk for breathing problems. Follow instructions from your health care provider about wearing your sleep device: Anytime you are sleeping, including during daytime naps. While taking prescription pain medicines, sleeping medicines, or medicines that make you drowsy. Avoid smoking. Keep all follow-up visits as told by your health care provider. This is important. Contact a health care provider if: You keep feeling nauseous or you keep vomiting. You feel light-headed. You are still sleepy or having trouble with balance after 24 hours. You develop a rash. You have a fever. You have redness or swelling around the IV site. Get help right  away if: You have trouble breathing. You have new-onset confusion at home. Summary For several hours after your procedure, you may feel tired. You may also be forgetful and have poor judgment. Have a responsible adult stay with you for the time you are told. It is important to have someone help care for you until you are awake and alert. Rest as told. Do not drive or operate machinery. Do not drink alcohol or take sleeping pills. Get help right away if you have trouble breathing, or if you suddenly become confused. This information is not intended to replace advice given to you by your health care provider. Make sure you discuss any questions you have with your health care provider. Document Revised: 08/28/2020 Document Reviewed: 11/15/2019 Elsevier Patient Education  2022 [employer].

## 2021-12-04 NOTE — Progress Notes (Signed)
Andrea Hughes [place]; [id number]; 1956/06/07   HPI Patient is a 65 year old white female who was referred to my care by Dr. Quintin Alto for a screening colonoscopy.  Patient last had a screening colonoscopy over 10 years ago.  She denies any immediate family history of colon cancer, blood per rectum, significant weight loss, abnormal diarrhea or constipation. Past Medical History:  Diagnosis Date   CAD (coronary artery disease)    a. 10/18/2014 NSTEMI, DES to mLAD 10/21/2014 b. 10/23/2014 NSTEMI, cath with patent stent, moderate stenosis in D3 enzyme elevation felt secondary to spasm with mild plaque shift in the diagonal   Depression    Essential hypertension    Hyperlipidemia    Morbid obesity (HCC)    NSTEMI (non-ST elevated myocardial infarction) [facility]) October 2015   Type 2 diabetes mellitus [facility])     Past Surgical History:  Procedure Laterality Date   BACK SURGERY  1988   BLADDER SUSPENSION     BREAST SURGERY     1984   CARDIAC CATHETERIZATION N/A 06/24/2016   Procedure: Left Heart Cath and Coronary Angiography;  Surgeon: Marykay Lex, MD;  Location: [facility] INVASIVE CV LAB;  Service: Cardiovascular;  Laterality: N/A;   CHOLECYSTECTOMY  2004   COLONOSCOPY  09/07/2011   Procedure: COLONOSCOPY;  Surgeon: Dalia Heading;  Location: [facility] ENDO SUITE;  Service: Gastroenterology;  Laterality: N/A;   LEFT HEART CATHETERIZATION WITH CORONARY ANGIOGRAM N/A 10/21/2014   Procedure: LEFT HEART CATHETERIZATION WITH CORONARY ANGIOGRAM;  Surgeon: Marykay Lex, MD;  Location[facility] CATH LAB;  Service: Cardiovascular;  Laterality: N/A;   LEFT HEART CATHETERIZATION WITH CORONARY ANGIOGRAM N/A 10/24/2014   Procedure: LEFT HEART CATHETERIZATION WITH CORONARY ANGIOGRAM;  Surgeon: Marykay Lex, MD;  Location: [facility] CATH LAB;  Service: Cardiovascular;  Laterality: N/A;   TONSILLECTOMY     65 years old   TUBAL LIGATION     VAGINAL HYSTERECTOMY      Family History  Problem Relation Age of Onset   Cancer  Father        Lung   Heart disease Mother        Valve replacement    Breast cancer Maternal Aunt    Breast cancer Cousin    Breast cancer Maternal Aunt    Breast cancer Cousin    Breast cancer Cousin     Current Outpatient Medications on File Prior to Visit  Medication Sig Dispense Refill   aspirin EC 81 MG tablet Take 1 tablet (81 mg total) by mouth daily. Swallow whole. 90 tablet 3   atorvastatin (LIPITOR) 80 MG tablet TAKE 1 TABLET BY MOUTH ONCE DAILY AT  6  PM 30 tablet 6   clonazePAM (KLONOPIN) 1 MG tablet Take 1 mg by mouth as needed for anxiety.     cyclobenzaprine (FLEXERIL) 5 MG tablet Take 2 tablets (10 mg total) by mouth 2 (two) times daily as needed for muscle spasms. 20 tablet 0   escitalopram (LEXAPRO) 10 MG tablet Take 10 mg by mouth daily.     fluticasone (FLONASE) 50 MCG/ACT nasal spray Place 1 spray into both nostrils as needed for allergies or rhinitis.     glipiZIDE (GLUCOTROL) 10 MG tablet Take 10 mg by mouth 2 (two) times daily before a meal.      isosorbide mononitrate (IMDUR) 60 MG 24 hr tablet Take 1 tablet by mouth once daily 90 tablet 2   losartan-hydrochlorothiazide (HYZAAR) 100-12.5 MG tablet Take 1 tablet by mouth daily.  metFORMIN (GLUCOPHAGE XR) 500 MG 24 hr tablet Take 4 tablets (2,000 mg total) by mouth daily with breakfast. HOLD for 48 hours, restart on 10/27/2014. 120 tablet 11   metoprolol tartrate (LOPRESSOR) 100 MG tablet Take 1 tablet (100 mg total) by mouth 2 (two) times daily. 180 tablet 3   nitroGLYCERIN (NITROSTAT) 0.4 MG SL tablet Place 1 tablet (0.4 mg total) under the tongue every 5 (five) minutes x 3 doses as needed for chest pain (if no relief after 3rd dose, proceed to the ED for an evaluation or call 911). If no relief after 3rd dose, proceed to the ED for an evaluation 25 tablet 3   omeprazole (PRILOSEC) 40 MG capsule Take 1 capsule by mouth daily.     No current facility-administered medications on file prior to visit.     Allergies  Allergen Reactions   Contrast Media [Iodinated Diagnostic Agents] Itching and Swelling   Sulfa Drugs Cross Reactors Swelling   Penicillins Rash    Social History   Substance and Sexual Activity  Alcohol Use No   Alcohol/week: 0.0 standard drinks    Social History   Tobacco Use  Smoking Status Never  Smokeless Tobacco Never    Review of Systems  Constitutional: Negative.   HENT:  Positive for sinus pain.   Eyes: Negative.   Respiratory: Negative.    Cardiovascular: Negative.   Gastrointestinal:  Positive for heartburn.  Genitourinary:  Positive for frequency.  Musculoskeletal: Negative.   Skin:  Positive for rash.  Neurological: Negative.   Endo/Heme/Allergies: Negative.   Psychiatric/Behavioral: Negative.     Objective   Vitals:   12/03/21 1023  BP: 120/81  Pulse: 72  Resp: 16  Temp: (!) 97.3 F (36.3 C)  SpO2: 96%    Physical Exam Vitals reviewed.  Constitutional:      Appearance: Normal appearance. She is obese. She is not ill-appearing.  HENT:     Head: Normocephalic and atraumatic.  Cardiovascular:     Rate and Rhythm: Normal rate and regular rhythm.     Heart sounds: Normal heart sounds. No murmur heard.   No friction rub. No gallop.  Pulmonary:     Effort: Pulmonary effort is normal. No respiratory distress.     Breath sounds: Normal breath sounds. No stridor. No wheezing, rhonchi or rales.  Abdominal:     General: There is no distension.     Palpations: Abdomen is soft. There is no mass.     Tenderness: There is no abdominal tenderness. There is no guarding or rebound.     Hernia: No hernia is present.  Skin:    General: Skin is warm and dry.  Neurological:     Mental Status: She is alert and oriented to person, place, and time.   Primary care notes reviewed Assessment  Need for screening colonoscopy Plan  Patient is scheduled for screening colonoscopy on 12/10/2021.  The risks and benefits of the procedure including  bleeding and perforation were fully explained to the patient, who gave informed consent.  Sutabs have been prescribed.

## 2021-12-08 ENCOUNTER — Encounter (HOSPITAL_COMMUNITY): Payer: Self-pay

## 2021-12-08 ENCOUNTER — Encounter (HOSPITAL_COMMUNITY)
Admission: RE | Admit: 2021-12-08 | Discharge: 2021-12-08 | Disposition: A | Discharge status: Home / Self Care | Payer: 59 | Source: Ambulatory Visit | Attending: General Surgery | Admitting: General Surgery

## 2021-12-08 ENCOUNTER — Other Ambulatory Visit: Payer: Self-pay

## 2021-12-10 ENCOUNTER — Ambulatory Visit (HOSPITAL_COMMUNITY): Payer: 59 | Admitting: Anesthesiology

## 2021-12-10 ENCOUNTER — Encounter (HOSPITAL_COMMUNITY)
Admission: RE | Disposition: A | Discharge status: Home / Self Care | Payer: Self-pay | Source: Home / Self Care | Attending: General Surgery

## 2021-12-10 ENCOUNTER — Ambulatory Visit (HOSPITAL_COMMUNITY)
Admission: RE | Admit: 2021-12-10 | Discharge: 2021-12-10 | Disposition: A | Discharge status: Home / Self Care | Payer: 59 | Attending: General Surgery | Admitting: General Surgery

## 2021-12-10 ENCOUNTER — Encounter (HOSPITAL_COMMUNITY): Payer: Self-pay | Admitting: General Surgery

## 2021-12-10 DIAGNOSIS — K5730 Diverticulosis of large intestine without perforation or abscess without bleeding: Secondary | ICD-10-CM | POA: Diagnosis not present

## 2021-12-10 DIAGNOSIS — I2510 Atherosclerotic heart disease of native coronary artery without angina pectoris: Secondary | ICD-10-CM | POA: Insufficient documentation

## 2021-12-10 DIAGNOSIS — Z955 Presence of coronary angioplasty implant and graft: Secondary | ICD-10-CM | POA: Insufficient documentation

## 2021-12-10 DIAGNOSIS — K644 Residual hemorrhoidal skin tags: Secondary | ICD-10-CM | POA: Diagnosis not present

## 2021-12-10 DIAGNOSIS — I252 Old myocardial infarction: Secondary | ICD-10-CM | POA: Diagnosis not present

## 2021-12-10 DIAGNOSIS — F32A Depression, unspecified: Secondary | ICD-10-CM | POA: Diagnosis not present

## 2021-12-10 DIAGNOSIS — Z1211 Encounter for screening for malignant neoplasm of colon: Secondary | ICD-10-CM | POA: Diagnosis not present

## 2021-12-10 DIAGNOSIS — I10 Essential (primary) hypertension: Secondary | ICD-10-CM | POA: Diagnosis not present

## 2021-12-10 DIAGNOSIS — Z79899 Other long term (current) drug therapy: Secondary | ICD-10-CM | POA: Diagnosis not present

## 2021-12-10 DIAGNOSIS — K219 Gastro-esophageal reflux disease without esophagitis: Secondary | ICD-10-CM | POA: Insufficient documentation

## 2021-12-10 DIAGNOSIS — E119 Type 2 diabetes mellitus without complications: Secondary | ICD-10-CM | POA: Insufficient documentation

## 2021-12-10 DIAGNOSIS — Z7984 Long term (current) use of oral hypoglycemic drugs: Secondary | ICD-10-CM | POA: Insufficient documentation

## 2021-12-10 HISTORY — PX: COLONOSCOPY WITH PROPOFOL: SHX5780

## 2021-12-10 LAB — GLUCOSE, CAPILLARY: Glucose-Capillary: 217 mg/dL — ABNORMAL HIGH (ref 70–99)

## 2021-12-10 SURGERY — COLONOSCOPY WITH PROPOFOL
Anesthesia: General

## 2021-12-10 MED ORDER — PROPOFOL 500 MG/50ML IV EMUL
INTRAVENOUS | Status: DC | PRN
  Administered 2021-12-10: 150 ug/kg/min via INTRAVENOUS

## 2021-12-10 MED ORDER — CHLORHEXIDINE GLUCONATE CLOTH 2 % EX PADS: 6.0000 | MEDICATED_PAD | Freq: Once | CUTANEOUS | Status: DC

## 2021-12-10 MED ORDER — LIDOCAINE HCL 1 % IJ SOLN
INTRAMUSCULAR | Status: DC | PRN
  Administered 2021-12-10: 50 mg via INTRADERMAL

## 2021-12-10 MED ORDER — LACTATED RINGERS IV SOLN: INTRAVENOUS | Status: DC

## 2021-12-10 MED ORDER — LIDOCAINE HCL (PF) 2 % IJ SOLN
INTRAMUSCULAR | Status: AC
  Filled 2021-12-10: qty 5

## 2021-12-10 MED ORDER — PROPOFOL 10 MG/ML IV BOLUS
INTRAVENOUS | Status: DC | PRN
  Administered 2021-12-10: 50 mg via INTRAVENOUS
  Administered 2021-12-10: 20 mg via INTRAVENOUS

## 2021-12-10 MED ORDER — PROPOFOL 1000 MG/100ML IV EMUL
INTRAVENOUS | Status: AC
  Filled 2021-12-10: qty 100

## 2021-12-10 NOTE — Transfer of Care (Signed)
Immediate Anesthesia Transfer of Care Note  Patient: Andrea Hughes  Procedure(s) Performed: COLONOSCOPY WITH PROPOFOL  Patient Location: Short Stay  Anesthesia Type:General  Level of Consciousness: awake  Airway & Oxygen Therapy: Patient Spontanous Breathing  Post-op Assessment: Report given to RN  Post vital signs: Reviewed  Last Vitals:  Vitals Value Taken Time  BP 119/49 12/10/21 0751  Temp    Pulse 68 12/10/21 0751  Resp 15 12/10/21 0751  SpO2 96 % 12/10/21 0751    Last Pain:  Vitals:   12/10/21 0751  TempSrc:   PainSc: 0-No pain      Patients Stated Pain Goal: 5 (12/10/21 0634)  Complications: No notable events documented.

## 2021-12-10 NOTE — Op Note (Signed)
[facility] Patient Name: Andrea Hughes Procedure Date: 12/10/2021 7:30 AM MRN: [id number] Date of Birth: 26-Mar-1956 Attending MD: Franky Macho , MD CSN: [id number] Age: 65 Admit Type: Outpatient Procedure:                Colonoscopy Indications:              Screening for colorectal malignant neoplasm Providers:                Franky Macho, MD, Sheran Fava, Pandora Leiter,                            Technician Referring MD:              Medicines:                Monitored Anesthesia Care, Propofol per Anesthesia Complications:            No immediate complications. Estimated Blood Loss:     Estimated blood loss: none. Procedure:                Pre-Anesthesia Assessment:                           - Prior to the procedure, a History and Physical                            was performed, and patient medications and                            allergies were reviewed. The patient is competent.                            The risks and benefits of the procedure and the                            sedation options and risks were discussed with the                            patient. All questions were answered and informed                            consent was obtained. Patient identification and                            proposed procedure were verified by the physician,                            the nurse, the anesthetist and the technician in                            the endoscopy suite. Mental Status Examination:                            alert and oriented. Airway Examination: normal  oropharyngeal airway and neck mobility. Respiratory                            Examination: clear to auscultation. CV Examination:                            RRR, no murmurs, no S3 or S4. Prophylactic                            Antibiotics: The patient does not require                            prophylactic antibiotics. Prior Anticoagulants: The                             patient has taken no previous anticoagulant or                            antiplatelet agents. ASA Grade Assessment: II - A                            patient with mild systemic disease. After reviewing                            the risks and benefits, the patient was deemed in                            satisfactory condition to undergo the procedure.                            The anesthesia plan was to use moderate sedation /                            analgesia (conscious sedation). Immediately prior                            to administration of medications, the patient was                            re-assessed for adequacy to receive sedatives. The                            heart rate, respiratory rate, oxygen saturations,                            blood pressure, adequacy of pulmonary ventilation,                            and response to care were monitored throughout the                            procedure. The physical status of the patient was  re-assessed after the procedure.                           After obtaining informed consent, the colonoscope                            was passed under direct vision. Throughout the                            procedure, the patient's blood pressure, pulse, and                            oxygen saturations were monitored continuously. The                            [id number]) scope was introduced through the                            anus and advanced to the the cecum, identified by                            the appendiceal orifice, ileocecal valve and                            palpation. No anatomical landmarks were                            photographed. The entire colon was well visualized.                            The colonoscopy was performed without difficulty.                            The patient tolerated the procedure well. The                            quality of the  bowel preparation was adequate. The                            total duration of the procedure was 9 minutes. Scope In: 7:34:56 AM Scope Out: 7:43:28 AM Scope Withdrawal Time: 0 hours 3 minutes 51 seconds  Total Procedure Duration: 0 hours 8 minutes 32 seconds  Findings:      Skin tags were found on perianal exam.      Multiple small-mouthed diverticula were found in the sigmoid colon.       There was no evidence of diverticular bleeding. Estimated blood loss:       none.      The exam was otherwise without abnormality on direct and retroflexion       views. Impression:               - Perianal skin tags found on perianal exam.                           - Mild diverticulosis in the sigmoid colon. There  was no evidence of diverticular bleeding.                           - The examination was otherwise normal on direct                            and retroflexion views.                           - No specimens collected. Moderate Sedation:      Moderate (conscious) sedation was personally administered by an       anesthesia professional. The following parameters were monitored: oxygen       saturation, heart rate, blood pressure, and response to care. Recommendation:           - Written discharge instructions were provided to                            the patient.                           - The signs and symptoms of potential delayed                            complications were discussed with the patient.                           - Patient has a contact number available for                            emergencies.                           - Return to normal activities tomorrow.                           - Resume previous diet.                           - Continue present medications.                           - Repeat colonoscopy in 10 years for screening                            purposes. Procedure Code(s):        --- Professional ---                            [phone], Colonoscopy, flexible; diagnostic, including                            collection of specimen(s) by brushing or washing,                            when performed (separate procedure) Diagnosis Code(s):        --- Professional ---  Z12.11, Encounter for screening for malignant                            neoplasm of colon                           K57.30, Diverticulosis of large intestine without                            perforation or abscess without bleeding                           K64.4, Residual hemorrhoidal skin tags CPT copyright 2019 American Medical Association. All rights reserved. The codes documented in this report are preliminary and upon coder review may  be revised to meet current compliance requirements. Franky Macho, MD Franky Macho, MD 12/10/2021 7:57:49 AM This report has been signed electronically. Number of Addenda: 0

## 2021-12-10 NOTE — Interval H&P Note (Signed)
History and Physical Interval Note:  12/10/2021 7:15 AM  Andrea Hughes  has presented today for surgery, with the diagnosis of Screening.  The various methods of treatment have been discussed with the patient and family. After consideration of risks, benefits and other options for treatment, the patient has consented to  Procedure(s) with comments: COLONOSCOPY WITH PROPOFOL (N/A) - ok per Shawna Orleans as a surgical intervention.  The patient's history has been reviewed, patient examined, no change in status, stable for surgery.  I have reviewed the patient's chart and labs.  Questions were answered to the patient's satisfaction.     Franky Macho

## 2021-12-10 NOTE — Anesthesia Preprocedure Evaluation (Signed)
Anesthesia Evaluation  Patient identified by MRN, date of birth, ID band Patient awake    Reviewed: Allergy & Precautions, NPO status , Patient's Chart, lab work & pertinent test results, reviewed documented beta blocker date and time   Airway Mallampati: II  TM Distance: >3 FB Neck ROM: Full    Dental  (+) Dental Advisory Given, Teeth Intact   Pulmonary neg pulmonary ROS,    Pulmonary exam normal breath sounds clear to auscultation       Cardiovascular Exercise Tolerance: Good hypertension, Pt. on medications and Pt. on home beta blockers + angina + CAD, + Past MI and + Cardiac Stents  Normal cardiovascular exam Rhythm:Regular Rate:Normal     Neuro/Psych PSYCHIATRIC DISORDERS Depression negative neurological ROS     GI/Hepatic Neg liver ROS, GERD  Medicated and Controlled,  Endo/Other  diabetes, Well Controlled, Type 2, Oral Hypoglycemic AgentsMorbid obesity  Renal/GU negative Renal ROS     Musculoskeletal   Abdominal   Peds  Hematology   Anesthesia Other Findings   Reproductive/Obstetrics                           Anesthesia Physical Anesthesia Plan  ASA: 3  Anesthesia Plan: General   Post-op Pain Management: Minimal or no pain anticipated   Induction:   PONV Risk Score and Plan: TIVA  Airway Management Planned: Nasal Cannula, Natural Airway and Simple Face Mask  Additional Equipment:   Intra-op Plan:   Post-operative Plan:   Informed Consent: I have reviewed the patients History and Physical, chart, labs and discussed the procedure including the risks, benefits and alternatives for the proposed anesthesia with the patient or authorized representative who has indicated his/her understanding and acceptance.     Dental advisory given  Plan Discussed with: CRNA and Surgeon  Anesthesia Plan Comments:         Anesthesia Quick Evaluation

## 2021-12-10 NOTE — Anesthesia Postprocedure Evaluation (Signed)
Anesthesia Post Note  Patient: Andrea Hughes  Procedure(s) Performed: COLONOSCOPY WITH PROPOFOL  Patient location during evaluation: Short Stay Anesthesia Type: General Level of consciousness: awake and alert Pain management: pain level controlled Vital Signs Assessment: post-procedure vital signs reviewed and stable Respiratory status: spontaneous breathing Cardiovascular status: blood pressure returned to baseline and stable Postop Assessment: no apparent nausea or vomiting Anesthetic complications: no   No notable events documented.   Last Vitals:  Vitals:   12/10/21 0634 12/10/21 0751  BP: (!) 144/74 (!) 119/49  Pulse: 72 68  Resp: 18 15  Temp: 36.7 C   SpO2: 99% 96%    Last Pain:  Vitals:   12/10/21 0751  TempSrc:   PainSc: 0-No pain                 Brynli Ollis

## 2021-12-15 ENCOUNTER — Encounter (HOSPITAL_COMMUNITY): Payer: Self-pay | Admitting: General Surgery

## 2022-01-23 ENCOUNTER — Emergency Department (HOSPITAL_COMMUNITY)
Admission: EM | Admit: 2022-01-23 | Discharge: 2022-01-23 | Disposition: A | Discharge status: Home / Self Care | Payer: 59 | Attending: Emergency Medicine | Admitting: Emergency Medicine

## 2022-01-23 ENCOUNTER — Encounter (HOSPITAL_COMMUNITY): Payer: Self-pay | Admitting: Emergency Medicine

## 2022-01-23 ENCOUNTER — Other Ambulatory Visit: Payer: Self-pay

## 2022-01-23 ENCOUNTER — Emergency Department (HOSPITAL_COMMUNITY): Payer: 59

## 2022-01-23 DIAGNOSIS — Z7982 Long term (current) use of aspirin: Secondary | ICD-10-CM | POA: Insufficient documentation

## 2022-01-23 DIAGNOSIS — M1711 Unilateral primary osteoarthritis, right knee: Secondary | ICD-10-CM | POA: Diagnosis not present

## 2022-01-23 DIAGNOSIS — E119 Type 2 diabetes mellitus without complications: Secondary | ICD-10-CM | POA: Insufficient documentation

## 2022-01-23 DIAGNOSIS — Z79899 Other long term (current) drug therapy: Secondary | ICD-10-CM | POA: Insufficient documentation

## 2022-01-23 DIAGNOSIS — M25561 Pain in right knee: Secondary | ICD-10-CM | POA: Diagnosis not present

## 2022-01-23 DIAGNOSIS — M25569 Pain in unspecified knee: Secondary | ICD-10-CM

## 2022-01-23 DIAGNOSIS — I10 Essential (primary) hypertension: Secondary | ICD-10-CM | POA: Diagnosis not present

## 2022-01-23 DIAGNOSIS — I2510 Atherosclerotic heart disease of native coronary artery without angina pectoris: Secondary | ICD-10-CM | POA: Diagnosis not present

## 2022-01-23 MED ORDER — NAPROXEN 375 MG PO TABS: 375.0000 mg | ORAL_TABLET | Freq: Two times a day (BID) | ORAL | 0 refills | Status: AC

## 2022-01-23 NOTE — ED Triage Notes (Signed)
Pt c/o right knee pain starting yesterday. Denies any known injury. States she was walking around the store and it "just started hurting. Previous history of "twisting knee." Pt able to bear weight but with extreme pain.

## 2022-01-23 NOTE — Discharge Instructions (Addendum)
Take the medications as needed for pain.  Use crutches for support.  Follow-up with your orthopedic doctor for further evaluation

## 2022-01-23 NOTE — ED Notes (Signed)
Pt d/c home per MD order., Discharge summary reviewed, verbalize understanding. Off unit via WC. No s/s of acute distress noted. Discharged home with husband.

## 2022-01-23 NOTE — ED Notes (Signed)
X-ray at bedside

## 2022-01-23 NOTE — ED Provider Notes (Signed)
[facility] EMERGENCY DEPARTMENT Provider Note   CSN: [id number] Arrival date & time: 01/23/22  1140     History  Chief complaint: Knee pain.  Andrea Hughes is a 66 y.o. female.  HPI  Patient has history of hypertension diabetes, hyperlipidemia, coronary artery disease and obesity.  Patient presents with complaints of right knee pain.  She denies any recent falls or injuries but she does note that intermittently she will get pain in her knee when she twists certain ways.  This morning the pain was more severe.  She had difficulty bending without pain and discomfort.  No fevers or chills no numbness or weakness  Home Medications Prior to Admission medications   Medication Sig Start Date End Date Taking? Authorizing Provider  naproxen (NAPROSYN) 375 MG tablet Take 1 tablet (375 mg total) by mouth 2 (two) times daily. 01/23/22  Yes Linwood Dibbles, MD  aspirin EC 81 MG tablet Take 1 tablet (81 mg total) by mouth daily. Swallow whole. 12/01/21   Jonelle Sidle, MD  atorvastatin (LIPITOR) 80 MG tablet TAKE 1 TABLET BY MOUTH ONCE DAILY AT  6  PM Patient taking differently: Take 80 mg by mouth at bedtime. 07/24/18   Jonelle Sidle, MD  bismuth subsalicylate (PEPTO BISMOL) 262 MG/15ML suspension Take 30 mLs by mouth every 6 (six) hours as needed for diarrhea or loose stools or indigestion.    [provider]  clonazePAM (KLONOPIN) 1 MG tablet Take 1 mg by mouth at bedtime.    [provider]  escitalopram (LEXAPRO) 10 MG tablet Take 10 mg by mouth daily.    [provider]  fluticasone (FLONASE) 50 MCG/ACT nasal spray Place 1 spray into both nostrils as needed for allergies or rhinitis.    [provider]  glipiZIDE (GLUCOTROL) 10 MG tablet Take 10 mg by mouth 2 (two) times daily before a meal.     [provider]  isosorbide mononitrate (IMDUR) 60 MG 24 hr tablet Take 1 tablet by mouth once daily 05/04/21   Jonelle Sidle, MD   losartan-hydrochlorothiazide (HYZAAR) 100-12.5 MG tablet Take 1 tablet by mouth daily.    [provider]  metFORMIN (GLUCOPHAGE XR) 500 MG 24 hr tablet Take 4 tablets (2,000 mg total) by mouth daily with breakfast. HOLD for 48 hours, restart on 10/27/2014. Patient taking differently: Take 1,000 mg by mouth 2 (two) times daily. 10/25/14   Barrett, Joline Salt, PA-C  metoprolol tartrate (LOPRESSOR) 100 MG tablet Take 1 tablet (100 mg total) by mouth 2 (two) times daily. 12/05/20   Jonelle Sidle, MD  nitroGLYCERIN (NITROSTAT) 0.4 MG SL tablet Place 1 tablet (0.4 mg total) under the tongue every 5 (five) minutes x 3 doses as needed for chest pain (if no relief after 3rd dose, proceed to the ED for an evaluation or call 911). If no relief after 3rd dose, proceed to the ED for an evaluation 05/15/20   Jonelle Sidle, MD  omeprazole (PRILOSEC) 40 MG capsule Take 1 capsule by mouth daily. 01/30/21   [provider]      Allergies    Cy Bl[place] oleracea], Contrast media [iodinated contrast media], Eggs or egg-derived products, Milk-related compounds, Ozempic (0.25 or 0.5 mg-dose) [semaglutide(0.25 or 0.5mg -dos)], Sulfa drugs cross reactors, Trulicity [dulaglutide], and Penicillins    Review of Systems   Review of Systems  Constitutional:  Negative for fever.   Physical Exam Updated Vital Signs BP (!) 133/93    Pulse 71  Temp 98.2 F (36.8 C)    Resp 16    Ht 1.626 m (5\' 4" )    Wt 120.2 kg    SpO2 99%    BMI 45.49 kg/m  Physical Exam Vitals and nursing note reviewed.  Constitutional:      General: She is not in acute distress.    Appearance: She is well-developed.  HENT:     Head: Normocephalic and atraumatic.     Right Ear: External ear normal.     Left Ear: External ear normal.  Eyes:     General: No scleral icterus.       Right eye: No discharge.        Left eye: No discharge.     Conjunctiva/sclera: Conjunctivae normal.  Neck:     Trachea: No tracheal  deviation.  Cardiovascular:     Rate and Rhythm: Normal rate.  Pulmonary:     Effort: Pulmonary effort is normal. No respiratory distress.     Breath sounds: No stridor.  Abdominal:     General: There is no distension.  Musculoskeletal:        General: Tenderness present. No swelling or deformity.     Cervical back: Neck supple.     Right knee: Effusion present. Tenderness present.     Comments: Small effusion noted on exam.  Pain with range of motion, no erythema or increased warmth.  Skin:    General: Skin is warm and dry.     Findings: No rash.  Neurological:     Mental Status: She is alert.     Cranial Nerves: Cranial nerve deficit: no gross deficits.    ED Results / Procedures / Treatments   Labs (all labs ordered are listed, but only abnormal results are displayed) Labs Reviewed - No data to display  EKG None  Radiology DG Knee Complete 4 Views Right  Result Date: 01/23/2022 CLINICAL DATA:  Acute right knee pain without known injury. EXAM: RIGHT KNEE - COMPLETE 4+ VIEW COMPARISON:  None. FINDINGS: No fracture or dislocation is noted. Small suprapatellar joint effusion is noted. Mild narrowing of medial joint space is noted. IMPRESSION: Mild degenerative joint disease is noted medially. Small suprapatellar joint effusion. No fracture or dislocation. Electronically Signed   By: Lupita RaiderJames  Green Jr M.D.   On: 01/23/2022 12:34    Procedures Procedures    Medications Ordered in ED Medications - No data to display  ED Course/ Medical Decision Making/ A&P Clinical Course as of 01/23/22 1316  Sat Jan 23, 2022  1258 DG Knee Complete 4 Views Right Knee x-ray images and radiology report reviewed.  Evidence of arthritis but no fracture or dislocation [JK]    Clinical Course User Index [JK] Linwood DibblesKnapp, Reinhard Schack, MD                           Medical Decision Making Amount and/or Complexity of Data Reviewed Radiology:  Decision-making details documented in ED Course.   Patient  symptoms likely related to degenerative changes.  No signs of acute fracture or signs of infection.  Will place in a knee immobilizer.  Patient has crutches at home.  Discussed NSAID use and outpatient orthopedic follow-up.  Evaluation and diagnostic testing in the emergency department does not suggest an emergent condition requiring admission or immediate intervention beyond what has been performed at this time.  The patient is safe for discharge and has been instructed to return immediately for worsening symptoms, change  in symptoms or any other concerns.         Final Clinical Impression(s) / ED Diagnoses Final diagnoses:  Knee pain  Osteoarthritis of right knee, unspecified osteoarthritis type    Rx / DC Orders ED Discharge Orders          Ordered    naproxen (NAPROSYN) 375 MG tablet  2 times daily        01/23/22 1315              Linwood Dibbles, MD 01/23/22 1316

## 2022-02-02 DIAGNOSIS — E1165 Type 2 diabetes mellitus with hyperglycemia: Secondary | ICD-10-CM | POA: Diagnosis not present

## 2022-02-02 DIAGNOSIS — E7800 Pure hypercholesterolemia, unspecified: Secondary | ICD-10-CM | POA: Diagnosis not present

## 2022-02-02 DIAGNOSIS — E7801 Familial hypercholesterolemia: Secondary | ICD-10-CM | POA: Diagnosis not present

## 2022-02-02 DIAGNOSIS — I10 Essential (primary) hypertension: Secondary | ICD-10-CM | POA: Diagnosis not present

## 2022-02-02 DIAGNOSIS — E1169 Type 2 diabetes mellitus with other specified complication: Secondary | ICD-10-CM | POA: Diagnosis not present

## 2022-02-02 DIAGNOSIS — K219 Gastro-esophageal reflux disease without esophagitis: Secondary | ICD-10-CM | POA: Diagnosis not present

## 2022-02-02 DIAGNOSIS — Z1329 Encounter for screening for other suspected endocrine disorder: Secondary | ICD-10-CM | POA: Diagnosis not present

## 2022-05-27 DIAGNOSIS — E1169 Type 2 diabetes mellitus with other specified complication: Secondary | ICD-10-CM | POA: Diagnosis not present

## 2022-05-27 DIAGNOSIS — E876 Hypokalemia: Secondary | ICD-10-CM | POA: Diagnosis not present

## 2022-05-27 DIAGNOSIS — E870 Hyperosmolality and hypernatremia: Secondary | ICD-10-CM | POA: Diagnosis not present

## 2022-05-27 DIAGNOSIS — E1165 Type 2 diabetes mellitus with hyperglycemia: Secondary | ICD-10-CM | POA: Diagnosis not present

## 2022-06-02 ENCOUNTER — Encounter: Payer: Self-pay | Admitting: Cardiology

## 2022-06-02 ENCOUNTER — Ambulatory Visit: Payer: 59 | Admitting: Cardiology

## 2022-06-02 VITALS — BP 122/84 | HR 77 | Ht 64.0 in | Wt 263.2 lb

## 2022-06-02 DIAGNOSIS — I25119 Atherosclerotic heart disease of native coronary artery with unspecified angina pectoris: Secondary | ICD-10-CM | POA: Diagnosis not present

## 2022-06-02 DIAGNOSIS — E782 Mixed hyperlipidemia: Secondary | ICD-10-CM

## 2022-06-02 NOTE — Patient Instructions (Addendum)

## 2022-06-02 NOTE — Progress Notes (Signed)
Cardiology Office Note  Date: 06/02/2022   ID: Andrea Hughes, DOB 08/31/1956, MRN [id number]  PCP:  Andrea Alcide, MD  Cardiologist:  Andrea Dell, MD Electrophysiologist:  None   Chief Complaint  Patient presents with   Cardiac follow-up    History of Present Illness: Andrea Hughes is a 66 y.o. female last seen in December 2022.  She is here for a routine visit.  Reports no angina symptoms since last encounter, continues on stable cardiac regimen as noted below.  She is still working on glucose control with her PCP.  Presently on glipizide and Glucophage.  Has had trouble tolerating Trulicity, Ozempic, and Jardiance.  At this point has not seen an endocrinologist.  I reviewed her lab work from November 2022 as noted below.  Past Medical History:  Diagnosis Date   CAD (coronary artery disease)    a. 10/18/2014 NSTEMI, DES to mLAD 10/21/2014 b. 10/23/2014 NSTEMI, cath with patent stent, moderate stenosis in D3 enzyme elevation felt secondary to spasm with mild plaque shift in the diagonal   Depression    Essential hypertension    Hyperlipidemia    Morbid obesity (HCC)    NSTEMI (non-ST elevated myocardial infarction) [facility]) October 2015   Type 2 diabetes mellitus [facility])     Past Surgical History:  Procedure Laterality Date   BACK SURGERY  1988   BLADDER SUSPENSION     BREAST SURGERY     1984   CARDIAC CATHETERIZATION N/A 06/24/2016   Procedure: Left Heart Cath and Coronary Angiography;  Surgeon: Andrea Lex, MD;  Location: [facility] INVASIVE CV LAB;  Service: Cardiovascular;  Laterality: N/A;   CHOLECYSTECTOMY  2004   COLONOSCOPY  09/07/2011   Procedure: COLONOSCOPY;  Surgeon: Andrea Hughes;  [facility]O [facility];  Service: Gastroenterology;  Laterality: N/A;   COLONOSCOPY WITH PROPOFOL N/A 12/10/2021   Procedure: COLONOSCOPY WITH PROPOFOL;  Surgeon: Andrea Macho, MD;  Location: [facility] ENDO [facility]ITE;  Service: Gastroenterology;  Laterality: N/A;  ok per [facility]    LEFT HEART CATHETERIZATION WITH CORONARY ANGIOGRAM N/A 10/21/2014   Procedure: LEFT HEART CATHETERIZATION WITH CORONARY ANGIOGRAM;  Surgeon: Andrea Lex, MD;  Location[facility] CATH LAB;  Service: Cardiovascular;  Laterality: N/A;   LEFT HEART CATHETERIZATION WITH CORONARY ANGIOGRAM N/A 10/24/2014   Procedure: LEFT HEART CATHETERIZATION WITH CORONARY ANGIOGRAM;  Surgeon: Andrea Lex, MD;  Location: [facility] CATH LAB;  Service: Cardiovascular;  Laterality: N/A;   TONSILLECTOMY     66 years old   TUBAL LIGATION     VAGINAL HYSTERECTOMY      Current Outpatient Medications  Medication Sig Dispense Refill   aspirin EC 81 MG tablet Take 1 tablet (81 mg total) by mouth daily. Swallow whole. 90 tablet 3   atorvastatin (LIPITOR) 80 MG tablet TAKE 1 TABLET BY MOUTH ONCE DAILY AT  6  PM (Patient taking differently: Take 80 mg by mouth at bedtime.) 30 tablet 6   bismuth subsalicylate (PEPTO BISMOL) 262 MG/15ML suspension Take 30 mLs by mouth every 6 (six) hours as needed for diarrhea or loose stools or indigestion.     clonazePAM (KLONOPIN) 1 MG tablet Take 1 mg by mouth at bedtime.     escitalopram (LEXAPRO) 10 MG tablet Take 10 mg by mouth daily.     fluticasone (FLONASE) 50 MCG/ACT nasal spray Place 1 spray into both nostrils as needed for allergies or rhinitis.     glipiZIDE (GLUCOTROL) 10 MG tablet Take 10 mg by  mouth 2 (two) times daily before a meal.      isosorbide mononitrate (IMDUR) 60 MG 24 hr tablet Take 1 tablet by mouth once daily 90 tablet 2   losartan-hydrochlorothiazide (HYZAAR) 100-12.5 MG tablet Take 1 tablet by mouth daily.     metFORMIN (GLUCOPHAGE XR) 500 MG 24 hr tablet Take 4 tablets (2,000 mg total) by mouth daily with breakfast. HOLD for 48 hours, restart on 10/27/2014. (Patient taking differently: Take 1,000 mg by mouth 2 (two) times daily.) 120 tablet 11   metoprolol tartrate (LOPRESSOR) 100 MG tablet Take 1 tablet (100 mg total) by mouth 2 (two) times daily. 180 tablet 3    nitroGLYCERIN (NITROSTAT) 0.4 MG SL tablet Place 1 tablet (0.4 mg total) under the tongue every 5 (five) minutes x 3 doses as needed for chest pain (if no relief after 3rd dose, proceed to the ED for an evaluation or call 911). If no relief after 3rd dose, proceed to the ED for an evaluation 25 tablet 3   omeprazole (PRILOSEC) 40 MG capsule Take 1 capsule by mouth daily.     naproxen (NAPROSYN) 375 MG tablet Take 1 tablet (375 mg total) by mouth 2 (two) times daily. (Patient not taking: Reported on 06/02/2022) 20 tablet 0   No current facility-administered medications for this visit.   Allergies:  Broccoli [brassica oleracea], Contrast media [iodinated contrast media], Eggs or egg-derived products, Januvia [sitagliptin], Jardiance [empagliflozin], Milk-related compounds, Ozempic (0.25 or 0.5 mg-dose) [semaglutide(0.25 or 0.5mg -dos)], Sulfa drugs cross reactors, Trulicity [dulaglutide], and Penicillins   ROS:  No syncope.   Physical Exam: VS:  BP 122/84   Pulse 77   Ht 5\' 4"  (1.626 m)   Wt 263 lb 3.2 oz (119.4 kg)   SpO2 96%   BMI 45.18 kg/m , BMI Body mass index is 45.18 kg/m.  Wt Readings from Last 3 Encounters:  06/02/22 263 lb 3.2 oz (119.4 kg)  01/23/22 265 lb (120.2 kg)  12/08/21 265 lb (120.2 kg)    General: Patient appears comfortable at rest. HEENT: Conjunctiva and lids normal. Neck: Supple, no elevated JVP or carotid bruits, no thyromegaly. Lungs: Clear to auscultation, nonlabored breathing at rest. Cardiac: Regular rate and rhythm, no S3 or significant systolic murmur. Abdomen: Soft, bowel sounds present. Extremities: No pitting edema.  ECG:  An ECG dated 12/01/2021 was personally reviewed today and demonstrated:  Sinus rhythm with leftward axis, increased voltage, poor R wave progression.  Recent Labwork:  November 2022: BUN 16, creatinine 0.76, potassium 4.5, AST 15, ALT 20, hemoglobin 13.1, platelets 195, hemoglobin A1c 7.4%, cholesterol 163, triglycerides 147, HDL 54,  LDL 84  Other Studies Reviewed Today:  No interval cardiac testing for review today  Assessment and Plan:  1.  CAD status post DES to the LAD with chronically jailed diagonal branch, stable without active angina on medical therapy.  Continue aspirin, Imdur, Lopressor, Hyzaar, Lipitor, and as needed nitroglycerin.  2.  Mixed hyperlipidemia, tolerating Lipitor.  Last LDL was down to 84.  Medication Adjustments/Labs and Tests Ordered: Current medicines are reviewed at length with the patient today.  Concerns regarding medicines are outlined above.   Tests Ordered: No orders of the defined types were placed in this encounter.   Medication Changes: No orders of the defined types were placed in this encounter.   Disposition:  Follow up  6 months.  Signed, December 2022, MD, [facility] 06/02/2022 3:58 PM    [facility] at [facility] [address]  [place], [place] [address] Phone: [phone]; Fax: [phone]

## 2022-06-03 DIAGNOSIS — I2510 Atherosclerotic heart disease of native coronary artery without angina pectoris: Secondary | ICD-10-CM | POA: Diagnosis not present

## 2022-06-03 DIAGNOSIS — R69 Illness, unspecified: Secondary | ICD-10-CM | POA: Diagnosis not present

## 2022-06-03 DIAGNOSIS — I10 Essential (primary) hypertension: Secondary | ICD-10-CM | POA: Diagnosis not present

## 2022-06-03 DIAGNOSIS — Z6841 Body Mass Index (BMI) 40.0 and over, adult: Secondary | ICD-10-CM | POA: Diagnosis not present

## 2022-06-03 DIAGNOSIS — E1169 Type 2 diabetes mellitus with other specified complication: Secondary | ICD-10-CM | POA: Diagnosis not present

## 2022-07-09 ENCOUNTER — Other Ambulatory Visit: Payer: Self-pay | Admitting: Cardiology

## 2022-08-25 DIAGNOSIS — E7800 Pure hypercholesterolemia, unspecified: Secondary | ICD-10-CM | POA: Diagnosis not present

## 2022-08-25 DIAGNOSIS — E1165 Type 2 diabetes mellitus with hyperglycemia: Secondary | ICD-10-CM | POA: Diagnosis not present

## 2022-08-25 DIAGNOSIS — I10 Essential (primary) hypertension: Secondary | ICD-10-CM | POA: Diagnosis not present

## 2022-08-25 DIAGNOSIS — K219 Gastro-esophageal reflux disease without esophagitis: Secondary | ICD-10-CM | POA: Diagnosis not present

## 2022-08-25 DIAGNOSIS — E870 Hyperosmolality and hypernatremia: Secondary | ICD-10-CM | POA: Diagnosis not present

## 2022-08-25 DIAGNOSIS — E876 Hypokalemia: Secondary | ICD-10-CM | POA: Diagnosis not present

## 2022-09-01 DIAGNOSIS — R69 Illness, unspecified: Secondary | ICD-10-CM | POA: Diagnosis not present

## 2022-09-01 DIAGNOSIS — E1169 Type 2 diabetes mellitus with other specified complication: Secondary | ICD-10-CM | POA: Diagnosis not present

## 2022-09-01 DIAGNOSIS — I10 Essential (primary) hypertension: Secondary | ICD-10-CM | POA: Diagnosis not present

## 2022-09-01 DIAGNOSIS — I2510 Atherosclerotic heart disease of native coronary artery without angina pectoris: Secondary | ICD-10-CM | POA: Diagnosis not present

## 2022-09-01 DIAGNOSIS — Z6841 Body Mass Index (BMI) 40.0 and over, adult: Secondary | ICD-10-CM | POA: Diagnosis not present

## 2022-09-01 DIAGNOSIS — N3941 Urge incontinence: Secondary | ICD-10-CM | POA: Diagnosis not present

## 2022-12-02 DIAGNOSIS — Z6841 Body Mass Index (BMI) 40.0 and over, adult: Secondary | ICD-10-CM | POA: Diagnosis not present

## 2022-12-02 DIAGNOSIS — E1169 Type 2 diabetes mellitus with other specified complication: Secondary | ICD-10-CM | POA: Diagnosis not present

## 2022-12-02 DIAGNOSIS — I2510 Atherosclerotic heart disease of native coronary artery without angina pectoris: Secondary | ICD-10-CM | POA: Diagnosis not present

## 2022-12-02 DIAGNOSIS — R69 Illness, unspecified: Secondary | ICD-10-CM | POA: Diagnosis not present

## 2022-12-02 DIAGNOSIS — I10 Essential (primary) hypertension: Secondary | ICD-10-CM | POA: Diagnosis not present

## 2022-12-14 NOTE — Progress Notes (Unsigned)
Cardiology Office Note  Date: 12/15/2022   ID: Andrea Hughes, Andrea Hughes 1956/07/31, MRN [id number]  PCP:  Juliette Alcide, MD  Cardiologist:  Nona Dell, MD Electrophysiologist:  None   Chief Complaint  Patient presents with   Cardiac follow-up    History of Present Illness: Andrea Hughes is a 66 y.o. female last seen in June.  She is here today with her husband for a follow-up visit.  She does not report any definite angina, no nitroglycerin use.  Does have an occasional brief sensation in her chest associated with belching, correlation with soft drinks and most likely GI in etiology.  These are not exertional symptoms.  She does not report any palpitations or syncope.  I reviewed her medications which are outlined below.  She reports compliance with therapy.  Does need a refill for a fresh bottle of nitroglycerin.  She had lab work back in August at which point LDL was 77 on Lipitor.  I personally reviewed her ECG today which shows sinus rhythm with leftward axis and decreased R wave progression as before.  Past Medical History:  Diagnosis Date   CAD (coronary artery disease)    a. 10/18/2014 NSTEMI, DES to mLAD 10/21/2014 b. 10/23/2014 NSTEMI, cath with patent stent, moderate stenosis in D3 enzyme elevation felt secondary to spasm with mild plaque shift in the diagonal   Depression    Essential hypertension    Hyperlipidemia    Morbid obesity (HCC)    NSTEMI (non-ST elevated myocardial infarction) [facility]) October 2015   Type 2 diabetes mellitus [facility])     Past Surgical History:  Procedure Laterality Date   BACK SURGERY  1988   BLADDER SUSPENSION     BREAST SURGERY     1984   CARDIAC CATHETERIZATION N/A 06/24/2016   Procedure: Left Heart Cath and Coronary Angiography;  Surgeon: Marykay Lex, MD;  Location: [facility] INVASIVE CV LAB;  Service: Cardiovascular;  Laterality: N/A;   CHOLECYSTECTOMY  2004   COLONOSCOPY  09/07/2011   Procedure: COLONOSCOPY;  Surgeon: Dalia Heading;  [facility];  Service: Gastroenterology;  Laterality: N/A;   COLONOSCOPY WITH PROPOFOL N/A 12/10/2021   Procedure: COLONOSCOPY WITH PROPOFOL;  Surgeon: Franky Macho, MD;  [facility];  Service: Gastroenterology;  Laterality: N/A;  ok per [facility]   LEFT HEART CATHETERIZATION WITH CORONARY ANGIOGRAM N/A 10/21/2014   Procedure: LEFT HEART CATHETERIZATION WITH CORONARY ANGIOGRAM;  Surgeon: Marykay Lex, MD;  Location: [facility] CATH LAB;  Service: Cardiovascular;  Laterality: N/A;   LEFT HEART CATHETERIZATION WITH CORONARY ANGIOGRAM N/A 10/24/2014   Procedure: LEFT HEART CATHETERIZATION WITH CORONARY ANGIOGRAM;  Surgeon: Marykay Lex, MD;  Location: [facility] CATH LAB;  Service: Cardiovascular;  Laterality: N/A;   TONSILLECTOMY     66 years old   TUBAL LIGATION     VAGINAL HYSTERECTOMY      Current Outpatient Medications  Medication Sig Dispense Refill   aspirin EC 81 MG tablet Take 1 tablet (81 mg total) by mouth daily. Swallow whole. 90 tablet 3   atorvastatin (LIPITOR) 80 MG tablet TAKE 1 TABLET BY MOUTH ONCE DAILY AT  6  PM 30 tablet 6   bismuth subsalicylate (PEPTO BISMOL) 262 MG/15ML suspension Take 30 mLs by mouth every 6 (six) hours as needed for diarrhea or loose stools or indigestion.     clonazePAM (KLONOPIN) 1 MG tablet Take 1 mg by mouth at bedtime.     escitalopram (LEXAPRO) 10 MG tablet  Take 10 mg by mouth daily.     fluticasone (FLONASE) 50 MCG/ACT nasal spray Place 1 spray into both nostrils as needed for allergies or rhinitis.     glipiZIDE (GLUCOTROL) 10 MG tablet Take 10 mg by mouth 2 (two) times daily before a meal.      isosorbide mononitrate (IMDUR) 60 MG 24 hr tablet Take 1 tablet by mouth once daily 90 tablet 3   losartan-hydrochlorothiazide (HYZAAR) 100-12.5 MG tablet Take 1 tablet by mouth daily.     metFORMIN (GLUCOPHAGE XR) 500 MG 24 hr tablet Take 4 tablets (2,000 mg total) by mouth daily with breakfast. HOLD for 48 hours, restart on  10/27/2014. 120 tablet 11   metoprolol tartrate (LOPRESSOR) 100 MG tablet Take 1 tablet (100 mg total) by mouth 2 (two) times daily. 180 tablet 3   naproxen (NAPROSYN) 375 MG tablet Take 1 tablet (375 mg total) by mouth 2 (two) times daily. 20 tablet 0   omeprazole (PRILOSEC) 40 MG capsule Take 1 capsule by mouth daily.     nitroGLYCERIN (NITROSTAT) 0.4 MG SL tablet Place 1 tablet (0.4 mg total) under the tongue every 5 (five) minutes x 3 doses as needed for chest pain (if no relief after 3rd dose, proceed to the ED for an evaluation or call 911). If no relief after 3rd dose, proceed to the ED for an evaluation 25 tablet 3   No current facility-administered medications for this visit.   Allergies:  Broccoli [brassica oleracea], Contrast media [iodinated contrast media], Eggs or egg-derived products, Januvia [sitagliptin], Jardiance [empagliflozin], Milk-related compounds, Ozempic (0.25 or 0.5 mg-dose) [semaglutide(0.25 or 0.5mg -dos)], Sulfa drugs cross reactors, Trulicity [dulaglutide], and Penicillins   ROS:  No syncope.  Physical Exam: VS:  BP 120/72   Pulse 66   Ht 5\' 4"  (1.626 m)   Wt 259 lb (117.5 kg)   BMI 44.46 kg/m , BMI Body mass index is 44.46 kg/m.  Wt Readings from Last 3 Encounters:  12/15/22 259 lb (117.5 kg)  06/02/22 263 lb 3.2 oz (119.4 kg)  01/23/22 265 lb (120.2 kg)    General: Patient appears comfortable at rest. HEENT: Conjunctiva and lids normal. Neck: Supple, no elevated JVP or carotid bruits. Lungs: Clear to auscultation, nonlabored breathing at rest. Cardiac: Regular rate and rhythm, no S3 or significant systolic murmur. Extremities: No pitting edema.  ECG:  An ECG dated 12/01/2021 was personally reviewed today and demonstrated:  Sinus rhythm with leftward axis, increased voltage, poor R wave progression.  Recent Labwork:  February 2023: TSH 1.82 June 2023: BUN 15, creatinine 0.77, potassium 4.0, AST 13, ALT 20, hemoglobin A1c 10.3% August 2023:  Cholesterol 151, glycerides 128, HDL 51, LDL 77  Other Studies Reviewed Today:  No interval cardiac testing for review today.  Assessment and Plan:  1.  CAD status post DES to the LAD with chronically jailed diagonal branch that has been managed medically.  Refill provided for fresh bottle of nitroglycerin.  ECG reviewed and stable.  Continue aspirin, Lipitor, Imdur, Hyzaar, and Lopressor.  2.  Mixed hyperlipidemia on high-dose Lipitor.  Last LDL down to 77.  Continue with current treatment plan.  Medication Adjustments/Labs and Tests Ordered: Current medicines are reviewed at length with the patient today.  Concerns regarding medicines are outlined above.   Tests Ordered: Orders Placed This Encounter  Procedures   EKG 12-Lead    Medication Changes: Meds ordered this encounter  Medications   nitroGLYCERIN (NITROSTAT) 0.4 MG SL tablet    Sig:  Place 1 tablet (0.4 mg total) under the tongue every 5 (five) minutes x 3 doses as needed for chest pain (if no relief after 3rd dose, proceed to the ED for an evaluation or call 911). If no relief after 3rd dose, proceed to the ED for an evaluation    Dispense:  25 tablet    Refill:  3    Disposition:  Follow up  6 months.  Signed, Jonelle Sidle, MD, [facility] 12/15/2022 10:37 AM    [facility] at [facility] [address], [place], [place] [address] Phone: [phone]; Fax: [phone]

## 2022-12-15 ENCOUNTER — Encounter: Payer: Self-pay | Admitting: Cardiology

## 2022-12-15 ENCOUNTER — Ambulatory Visit: Payer: 59 | Attending: Cardiology | Admitting: Cardiology

## 2022-12-15 VITALS — BP 120/72 | HR 66 | Ht 64.0 in | Wt 259.0 lb

## 2022-12-15 DIAGNOSIS — E782 Mixed hyperlipidemia: Secondary | ICD-10-CM | POA: Diagnosis not present

## 2022-12-15 DIAGNOSIS — I25119 Atherosclerotic heart disease of native coronary artery with unspecified angina pectoris: Secondary | ICD-10-CM

## 2022-12-15 MED ORDER — NITROGLYCERIN 0.4 MG SL SUBL: 0.4000 mg | SUBLINGUAL_TABLET | SUBLINGUAL | 3 refills | Status: DC | PRN

## 2022-12-15 NOTE — Patient Instructions (Signed)
Medication Instructions:  Your physician recommends that you continue on your current medications as directed. Please refer to the Current Medication list given to you today.   Labwork: None  Testing/Procedures: None  Follow-Up: Follow up with Dr. McDowell in 6 months.   Any Other Special Instructions Will Be Listed Below (If Applicable).     If you need a refill on your cardiac medications before your next appointment, please call your pharmacy.  

## 2023-03-08 ENCOUNTER — Other Ambulatory Visit (HOSPITAL_COMMUNITY): Payer: Self-pay | Admitting: Family Medicine

## 2023-03-08 DIAGNOSIS — Z1231 Encounter for screening mammogram for malignant neoplasm of breast: Secondary | ICD-10-CM

## 2023-03-14 ENCOUNTER — Ambulatory Visit (HOSPITAL_COMMUNITY)
Admission: RE | Admit: 2023-03-14 | Discharge: 2023-03-14 | Disposition: A | Discharge status: Home / Self Care | Payer: 59 | Source: Ambulatory Visit | Attending: Family Medicine | Admitting: Family Medicine

## 2023-03-14 DIAGNOSIS — Z1231 Encounter for screening mammogram for malignant neoplasm of breast: Secondary | ICD-10-CM | POA: Insufficient documentation

## 2023-04-06 DIAGNOSIS — I10 Essential (primary) hypertension: Secondary | ICD-10-CM | POA: Diagnosis not present

## 2023-04-06 DIAGNOSIS — I252 Old myocardial infarction: Secondary | ICD-10-CM | POA: Diagnosis not present

## 2023-04-06 DIAGNOSIS — K219 Gastro-esophageal reflux disease without esophagitis: Secondary | ICD-10-CM | POA: Diagnosis not present

## 2023-04-06 DIAGNOSIS — Z803 Family history of malignant neoplasm of breast: Secondary | ICD-10-CM | POA: Diagnosis not present

## 2023-04-06 DIAGNOSIS — R32 Unspecified urinary incontinence: Secondary | ICD-10-CM | POA: Diagnosis not present

## 2023-04-06 DIAGNOSIS — J309 Allergic rhinitis, unspecified: Secondary | ICD-10-CM | POA: Diagnosis not present

## 2023-04-06 DIAGNOSIS — Z7984 Long term (current) use of oral hypoglycemic drugs: Secondary | ICD-10-CM | POA: Diagnosis not present

## 2023-04-06 DIAGNOSIS — Z8249 Family history of ischemic heart disease and other diseases of the circulatory system: Secondary | ICD-10-CM | POA: Diagnosis not present

## 2023-04-06 DIAGNOSIS — E119 Type 2 diabetes mellitus without complications: Secondary | ICD-10-CM | POA: Diagnosis not present

## 2023-04-06 DIAGNOSIS — F419 Anxiety disorder, unspecified: Secondary | ICD-10-CM | POA: Diagnosis not present

## 2023-04-06 DIAGNOSIS — M1990 Unspecified osteoarthritis, unspecified site: Secondary | ICD-10-CM | POA: Diagnosis not present

## 2023-04-06 DIAGNOSIS — E785 Hyperlipidemia, unspecified: Secondary | ICD-10-CM | POA: Diagnosis not present

## 2023-06-03 DIAGNOSIS — E1165 Type 2 diabetes mellitus with hyperglycemia: Secondary | ICD-10-CM | POA: Diagnosis not present

## 2023-06-03 DIAGNOSIS — E7800 Pure hypercholesterolemia, unspecified: Secondary | ICD-10-CM | POA: Diagnosis not present

## 2023-06-03 DIAGNOSIS — K219 Gastro-esophageal reflux disease without esophagitis: Secondary | ICD-10-CM | POA: Diagnosis not present

## 2023-06-03 DIAGNOSIS — E7801 Familial hypercholesterolemia: Secondary | ICD-10-CM | POA: Diagnosis not present

## 2023-06-03 DIAGNOSIS — E1169 Type 2 diabetes mellitus with other specified complication: Secondary | ICD-10-CM | POA: Diagnosis not present

## 2023-06-03 DIAGNOSIS — Z1329 Encounter for screening for other suspected endocrine disorder: Secondary | ICD-10-CM | POA: Diagnosis not present

## 2023-06-03 DIAGNOSIS — I10 Essential (primary) hypertension: Secondary | ICD-10-CM | POA: Diagnosis not present

## 2023-06-08 DIAGNOSIS — D696 Thrombocytopenia, unspecified: Secondary | ICD-10-CM | POA: Diagnosis not present

## 2023-06-08 DIAGNOSIS — I252 Old myocardial infarction: Secondary | ICD-10-CM | POA: Diagnosis not present

## 2023-06-08 DIAGNOSIS — N941 Unspecified dyspareunia: Secondary | ICD-10-CM | POA: Diagnosis not present

## 2023-06-08 DIAGNOSIS — F339 Major depressive disorder, recurrent, unspecified: Secondary | ICD-10-CM | POA: Diagnosis not present

## 2023-06-08 DIAGNOSIS — Z6841 Body Mass Index (BMI) 40.0 and over, adult: Secondary | ICD-10-CM | POA: Diagnosis not present

## 2023-06-08 DIAGNOSIS — I2510 Atherosclerotic heart disease of native coronary artery without angina pectoris: Secondary | ICD-10-CM | POA: Diagnosis not present

## 2023-06-08 DIAGNOSIS — I10 Essential (primary) hypertension: Secondary | ICD-10-CM | POA: Diagnosis not present

## 2023-06-08 DIAGNOSIS — E1169 Type 2 diabetes mellitus with other specified complication: Secondary | ICD-10-CM | POA: Diagnosis not present

## 2023-06-26 DIAGNOSIS — U071 COVID-19: Secondary | ICD-10-CM | POA: Diagnosis not present

## 2023-06-26 DIAGNOSIS — Z6841 Body Mass Index (BMI) 40.0 and over, adult: Secondary | ICD-10-CM | POA: Diagnosis not present

## 2023-07-18 ENCOUNTER — Other Ambulatory Visit: Payer: Self-pay | Admitting: Cardiology

## 2023-08-10 DIAGNOSIS — R32 Unspecified urinary incontinence: Secondary | ICD-10-CM | POA: Diagnosis not present

## 2023-08-10 DIAGNOSIS — I10 Essential (primary) hypertension: Secondary | ICD-10-CM | POA: Diagnosis not present

## 2023-08-10 DIAGNOSIS — I252 Old myocardial infarction: Secondary | ICD-10-CM | POA: Diagnosis not present

## 2023-08-10 DIAGNOSIS — Z7984 Long term (current) use of oral hypoglycemic drugs: Secondary | ICD-10-CM | POA: Diagnosis not present

## 2023-08-10 DIAGNOSIS — F329 Major depressive disorder, single episode, unspecified: Secondary | ICD-10-CM | POA: Diagnosis not present

## 2023-08-10 DIAGNOSIS — E119 Type 2 diabetes mellitus without complications: Secondary | ICD-10-CM | POA: Diagnosis not present

## 2023-08-10 DIAGNOSIS — Z008 Encounter for other general examination: Secondary | ICD-10-CM | POA: Diagnosis not present

## 2023-08-10 DIAGNOSIS — K219 Gastro-esophageal reflux disease without esophagitis: Secondary | ICD-10-CM | POA: Diagnosis not present

## 2023-08-10 DIAGNOSIS — Z809 Family history of malignant neoplasm, unspecified: Secondary | ICD-10-CM | POA: Diagnosis not present

## 2023-08-10 DIAGNOSIS — F419 Anxiety disorder, unspecified: Secondary | ICD-10-CM | POA: Diagnosis not present

## 2023-08-10 DIAGNOSIS — E785 Hyperlipidemia, unspecified: Secondary | ICD-10-CM | POA: Diagnosis not present

## 2023-08-10 DIAGNOSIS — Z8249 Family history of ischemic heart disease and other diseases of the circulatory system: Secondary | ICD-10-CM | POA: Diagnosis not present

## 2023-09-01 ENCOUNTER — Ambulatory Visit: Payer: Medicare HMO | Attending: Medical | Admitting: Medical

## 2023-09-01 ENCOUNTER — Encounter: Payer: Self-pay | Admitting: Medical

## 2023-09-01 VITALS — BP 110/80 | HR 78 | Ht 65.0 in | Wt 260.8 lb

## 2023-09-01 DIAGNOSIS — I10 Essential (primary) hypertension: Secondary | ICD-10-CM | POA: Diagnosis not present

## 2023-09-01 DIAGNOSIS — I2510 Atherosclerotic heart disease of native coronary artery without angina pectoris: Secondary | ICD-10-CM

## 2023-09-01 NOTE — Progress Notes (Signed)
Cardiology Office Note:    Date:  09/01/2023   ID:  Andrea Hughes, DOB May 19, 1956, MRN [id number]  PCP:  Juliette Alcide, MD  [facility] Cardiologist:  Nona Dell, MD  [facility] Electrophysiologist:  None   Referring MD: Juliette Alcide, MD   Chief Complaint: 6 month follow-up  History of Present Illness:    Andrea Hughes is a 67 y.o. female with a hx of CAD s/p DES mLAD 2015, HTN, HLD, DM2 who presents for follow-up.   The patient had a NSTEMI in 09/2014 and was treated with DES to mLAD. Repeat cath later that month showed patent stent and moderate stenosis in the D3, enzyme elevation was felt to be secondary to spasm with mild plaque shift. The patient had repeat cath in 2017 for progressive angina. Cath showed widely patent LAD stent. She did have jailing of the third diagonal with moderate stenosis that appeared similar to prior studies. No revascularization was performed.   The patient was last seen 11/2022 and was stable from a cardiac perspective.   Today, the patient is overall doing well from a cardiac perspective. She denies chest pain, shortness of breath, lower leg edema, orthopnea, pnd, palpitations. She had COVID in June and had lightheadedness and dizziness. This can still occur when she stands, does not happen often. She and her husband joined a gym a month ago. She has been going a couple times a week. Diet is mainly eating at home. Vitals are good today. PCP does lab work yearly.   Past Medical History:  Diagnosis Date   CAD (coronary artery disease)    a. 10/18/2014 NSTEMI, DES to mLAD 10/21/2014 b. 10/23/2014 NSTEMI, cath with patent stent, moderate stenosis in D3 enzyme elevation felt secondary to spasm with mild plaque shift in the diagonal   Depression    Essential hypertension    Hyperlipidemia    Morbid obesity (HCC)    NSTEMI (non-ST elevated myocardial infarction) [facility]) October 2015   Type 2 diabetes mellitus [facility])     Past Surgical  History:  Procedure Laterality Date   BACK SURGERY  1988   BLADDER SUSPENSION     BREAST SURGERY     1984   CARDIAC CATHETERIZATION N/A 06/24/2016   Procedure: Left Heart Cath and Coronary Angiography;  Surgeon: Marykay Lex, MD;  Location: [facility] INVASIVE CV LAB;  Service: Cardiovascular;  Laterality: N/A;   CHOLECYSTECTOMY  2004   COLONOSCOPY  09/07/2011   Procedure: COLONOSCOPY;  Surgeon: Dalia Heading;  Location: [facility] ENDO SUITE;  Service: Gastroenterology;  Laterality: N/A;   COLONOSCOPY WITH PROPOFOL N/A 12/10/2021   Procedure: COLONOSCOPY WITH PROPOFOL;  Surgeon: Franky Macho, MD;  Location: [facility] ENDO SUITE;  Service: Gastroenterology;  Laterality: N/A;  ok per [facility]   LEFT HEART CATHETERIZATION WITH CORONARY ANGIOGRAM N/A 10/21/2014   Procedure: LEFT HEART CATHETERIZATION WITH CORONARY ANGIOGRAM;  Surgeon: Marykay Lex, MD;  Location: [facility] CATH LAB;  Service: Cardiovascular;  Laterality: N/A;   LEFT HEART CATHETERIZATION WITH CORONARY ANGIOGRAM N/A 10/24/2014   Procedure: LEFT HEART CATHETERIZATION WITH CORONARY ANGIOGRAM;  Surgeon: Marykay Lex, MD;  Location: [facility] CATH LAB;  Service: Cardiovascular;  Laterality: N/A;   TONSILLECTOMY     67 years old   TUBAL LIGATION     VAGINAL HYSTERECTOMY      Current Medications: Current Meds  Medication Sig   aspirin EC 81 MG tablet Take 1 tablet (81 mg total) by mouth daily. Swallow whole.  atorvastatin (LIPITOR) 80 MG tablet TAKE 1 TABLET BY MOUTH ONCE DAILY AT  6  PM   bismuth subsalicylate (PEPTO BISMOL) 262 MG/15ML suspension Take 30 mLs by mouth every 6 (six) hours as needed for diarrhea or loose stools or indigestion.   clonazePAM (KLONOPIN) 1 MG tablet Take 1 mg by mouth at bedtime.   escitalopram (LEXAPRO) 20 MG tablet Take 20 mg by mouth every morning.   fluticasone (FLONASE) 50 MCG/ACT nasal spray Place 1 spray into both nostrils as needed for allergies or rhinitis.   glipiZIDE (GLUCOTROL) 10 MG tablet Take 10 mg by mouth 2  (two) times daily before a meal.    isosorbide mononitrate (IMDUR) 60 MG 24 hr tablet Take 1 tablet by mouth once daily   losartan-hydrochlorothiazide (HYZAAR) 100-12.5 MG tablet Take 1 tablet by mouth daily.   metFORMIN (GLUCOPHAGE) 500 MG tablet Take 1,000 mg by mouth 2 (two) times daily.   metoprolol tartrate (LOPRESSOR) 100 MG tablet Take 1 tablet (100 mg total) by mouth 2 (two) times daily.   naproxen (NAPROSYN) 375 MG tablet Take 1 tablet (375 mg total) by mouth 2 (two) times daily.   nitroGLYCERIN (NITROSTAT) 0.4 MG SL tablet Place 1 tablet (0.4 mg total) under the tongue every 5 (five) minutes x 3 doses as needed for chest pain (if no relief after 3rd dose, proceed to the ED for an evaluation or call 911). If no relief after 3rd dose, proceed to the ED for an evaluation   omeprazole (PRILOSEC) 40 MG capsule Take 1 capsule by mouth daily.     Allergies:   Broccoli [brassica oleracea], Contrast media [iodinated contrast media], Egg-derived products, Januvia [sitagliptin], Jardiance [empagliflozin], Milk-related compounds, Ozempic (0.25 or 0.5 mg-dose) [semaglutide(0.25 or 0.5mg -dos)], Sulfa drugs cross reactors, Trulicity [dulaglutide], and Penicillins   Social History   Socioeconomic History   Marital status: Married    Spouse name: Not on file   Number of children: 3   Years of education: Not on file   Highest education level: Not on file  Occupational History    Employer: [employer]  Tobacco Use   Smoking status: Never   Smokeless tobacco: Never  Vaping Use   Vaping status: Never Used  Substance and Sexual Activity   Alcohol use: No    Alcohol/week: 0.0 standard drinks of alcohol   Drug use: No   Sexual activity: Yes    Partners: Male  Other Topics Concern   Not on file  Social History Narrative   Lives at home with husband.     Social Determinants of Health   Financial Resource Strain: Not on file  Food Insecurity: Not on file  Transportation Needs: Not on  file  Physical Activity: Not on file  Stress: Not on file  Social Connections: Not on file     Family History: The patient's family history includes Breast cancer in her cousin, cousin, cousin, maternal aunt, and maternal aunt; Cancer in her father; Heart disease in her mother.  ROS:   Please see the history of present illness.     All other systems reviewed and are negative.  EKGs/Labs/Other Studies Reviewed:    The following studies were reviewed today:  Cardiac cath 2017 Ost 3rd Diag to 3rd Diag lesion - jailed by stent in LAD, 55% stenosed. Angiographic similar to last catheterization Widely patent mid LAD stent Normal EF with normal EDP     Angiographically no significant difference in findings from last catheterization. L diagonal appears to  be very similar to last catheterization. There could be some spasm of the slight, but this is not a good PCI target as it was not last time. The ostial nature of the lesion and size discrepancy between the diagonal and the LAD makes it very difficult PCI target. Would only be able to do balloon angioplasty which would be suboptimal.     Plan: Standard post catheterization care and removal.  Expected discharge home following bed rest Follow back up with Dr. Diona Browner. Consider microvascular ischemia versus non-anginal chest pain      EKG:  EKG is ordered today.  EKG shows NSR 72bpm, LAD, WI inferolateral leads  Recent Labs: No results found for requested labs within last 365 days.  Recent Lipid Panel    Component Value Date/Time   CHOL 117 12/03/2014 0911   TRIG 76 12/03/2014 0911   HDL 36 (L) 12/03/2014 0911   CHOLHDL 3.3 12/03/2014 0911   VLDL 15 12/03/2014 0911   LDLCALC 66 12/03/2014 0911     Physical Exam:    VS:  BP 110/80 (BP Location: Left Arm, Patient Position: Sitting, Cuff Size: Large)   Pulse 78   Ht 5\' 5"  (1.651 m)   Wt 260 lb 12.8 oz (118.3 kg)   SpO2 95%   BMI 43.40 kg/m     Wt Readings from Last 3  Encounters:  09/01/23 260 lb 12.8 oz (118.3 kg)  12/15/22 259 lb (117.5 kg)  06/02/22 263 lb 3.2 oz (119.4 kg)     GEN:  Well nourished, well developed in no acute distress HEENT: Normal NECK: No JVD; No carotid bruits LYMPHATICS: No lymphadenopathy CARDIAC: RRR, no murmurs, rubs, gallops RESPIRATORY:  Clear to auscultation without rales, wheezing or rhonchi  ABDOMEN: Soft, non-tender, non-distended MUSCULOSKELETAL:  No edema; No deformity  SKIN: Warm and dry NEUROLOGIC:  Alert and oriented x 3 PSYCHIATRIC:  Normal affect   ASSESSMENT:    1. Coronary artery disease involving native coronary artery of native heart without angina pectoris   2. Essential hypertension    PLAN:    In order of problems listed above:  CAD with remote stenting Patient denies any chest pain or shortness of breath.  Patient and her husband recently joined a gym and are working out a couple days a week.  No ischemic workup indicated at this time. Continue aspirin, Lipitor, Imdur, Hyzaar, we will nitro and Lopressor.  Hyperlipidemia I will request labs from PCP.  Continue Lipitor 80 mg daily.  Disposition: Follow up in 1 year(s) with MD/APP   Signed, Michaell Grider David Stall, PA-C  09/01/2023 2:04 PM    [facility]

## 2023-09-01 NOTE — Patient Instructions (Signed)
Medication Instructions:  Your physician recommends that you continue on your current medications as directed. Please refer to the Current Medication list given to you today.  *If you need a refill on your cardiac medications before your next appointment, please call your pharmacy*   Lab Work: NONE   If you have labs (blood work) drawn today and your tests are completely normal, you will receive your results only by: [facility] (if you have MyChart) OR A paper copy in the mail If you have any lab test that is abnormal or we need to change your treatment, we will call you to review the results.   Testing/Procedures: NONE    Follow-Up: At [facility], you and your health needs are our priority.  As part of our continuing mission to provide you with exceptional heart care, we have created designated Provider Care Teams.  These Care Teams include your primary Cardiologist (physician) and Advanced Practice Providers (APPs -  Physician Assistants and Nurse Practitioners) who all work together to provide you with the care you need, when you need it.  We recommend signing up for the patient portal called "MyChart".  Sign up information is provided on this After Visit Summary.  MyChart is used to connect with patients for Virtual Visits (Telemedicine).  Patients are able to view lab/test results, encounter notes, upcoming appointments, etc.  Non-urgent messages can be sent to your provider as well.   To learn more about what you can do with MyChart, go to [email or web address].    Your next appointment:   1 year(s)  Provider:   You may see Rozann Lesches, MD or one of the following Advanced Practice Providers on your designated Care Team:   Bernerd Pho, PA-C  Ermalinda Barrios, PA-C     Other Instructions Thank you for choosing [facility]!

## 2023-09-07 DIAGNOSIS — E559 Vitamin D deficiency, unspecified: Secondary | ICD-10-CM | POA: Diagnosis not present

## 2023-09-07 DIAGNOSIS — F339 Major depressive disorder, recurrent, unspecified: Secondary | ICD-10-CM | POA: Diagnosis not present

## 2023-09-07 DIAGNOSIS — I10 Essential (primary) hypertension: Secondary | ICD-10-CM | POA: Diagnosis not present

## 2023-09-07 DIAGNOSIS — N898 Other specified noninflammatory disorders of vagina: Secondary | ICD-10-CM | POA: Diagnosis not present

## 2023-09-07 DIAGNOSIS — I2510 Atherosclerotic heart disease of native coronary artery without angina pectoris: Secondary | ICD-10-CM | POA: Diagnosis not present

## 2023-09-07 DIAGNOSIS — E538 Deficiency of other specified B group vitamins: Secondary | ICD-10-CM | POA: Diagnosis not present

## 2023-09-07 DIAGNOSIS — Z23 Encounter for immunization: Secondary | ICD-10-CM | POA: Diagnosis not present

## 2023-09-07 DIAGNOSIS — E1169 Type 2 diabetes mellitus with other specified complication: Secondary | ICD-10-CM | POA: Diagnosis not present

## 2023-09-07 DIAGNOSIS — D696 Thrombocytopenia, unspecified: Secondary | ICD-10-CM | POA: Diagnosis not present

## 2023-09-07 DIAGNOSIS — N941 Unspecified dyspareunia: Secondary | ICD-10-CM | POA: Diagnosis not present

## 2023-10-06 IMAGING — DX DG KNEE COMPLETE 4+V*R*
4 series · 4 of 4 positions shown · non-contrast
Comparison: None.

CLINICAL DATA: Acute right knee pain without known injury.

EXAM:
RIGHT KNEE - COMPLETE 4+ VIEW

[knee ap]
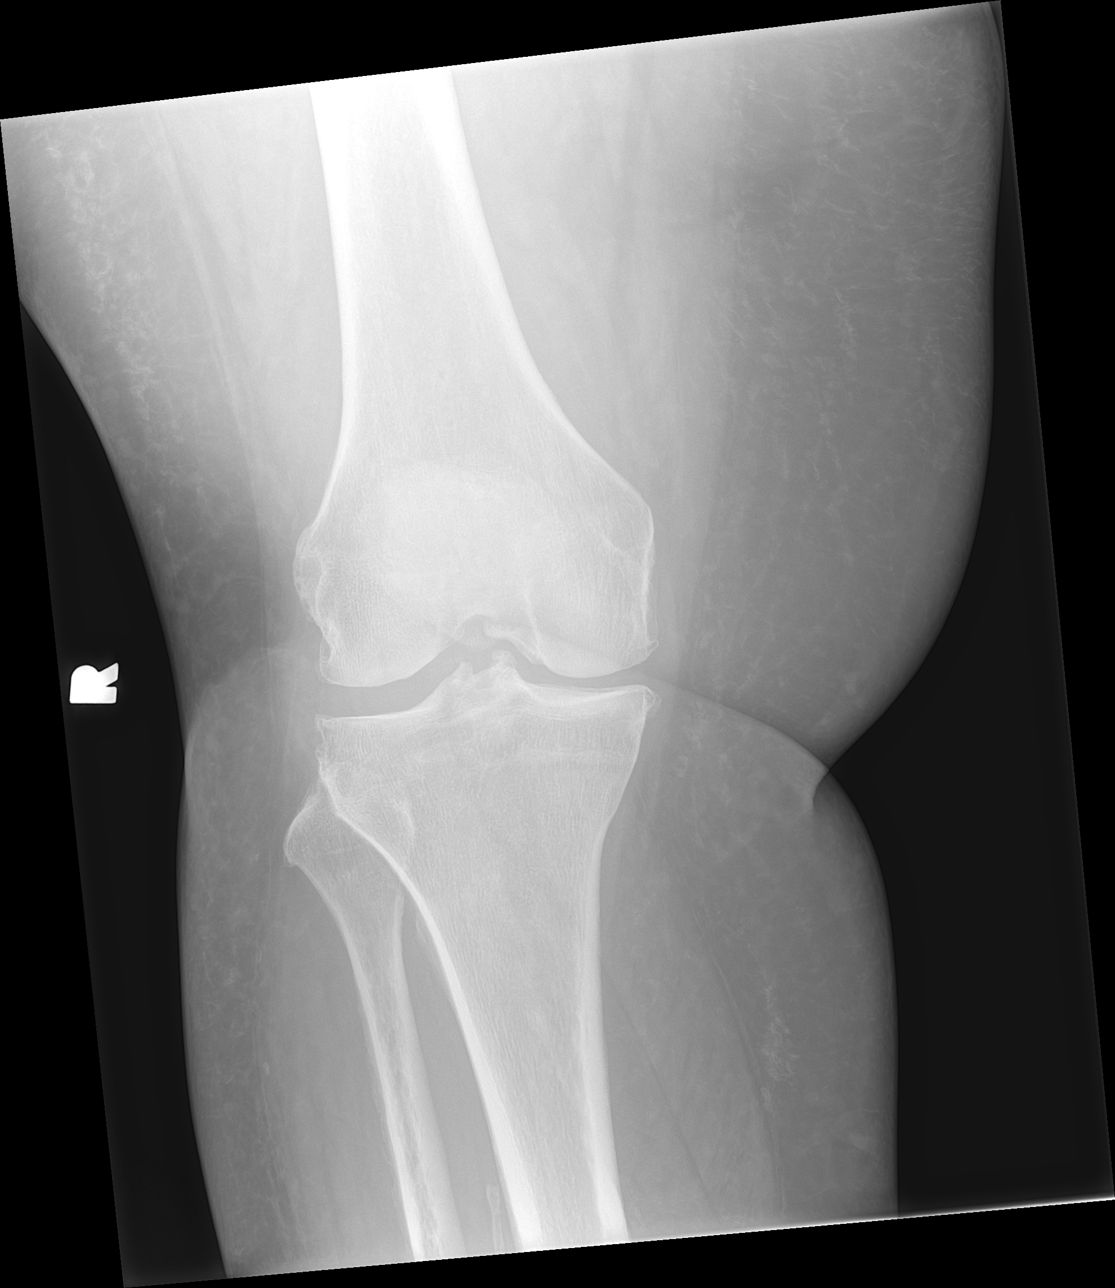

[knee obl (1 of 2)]
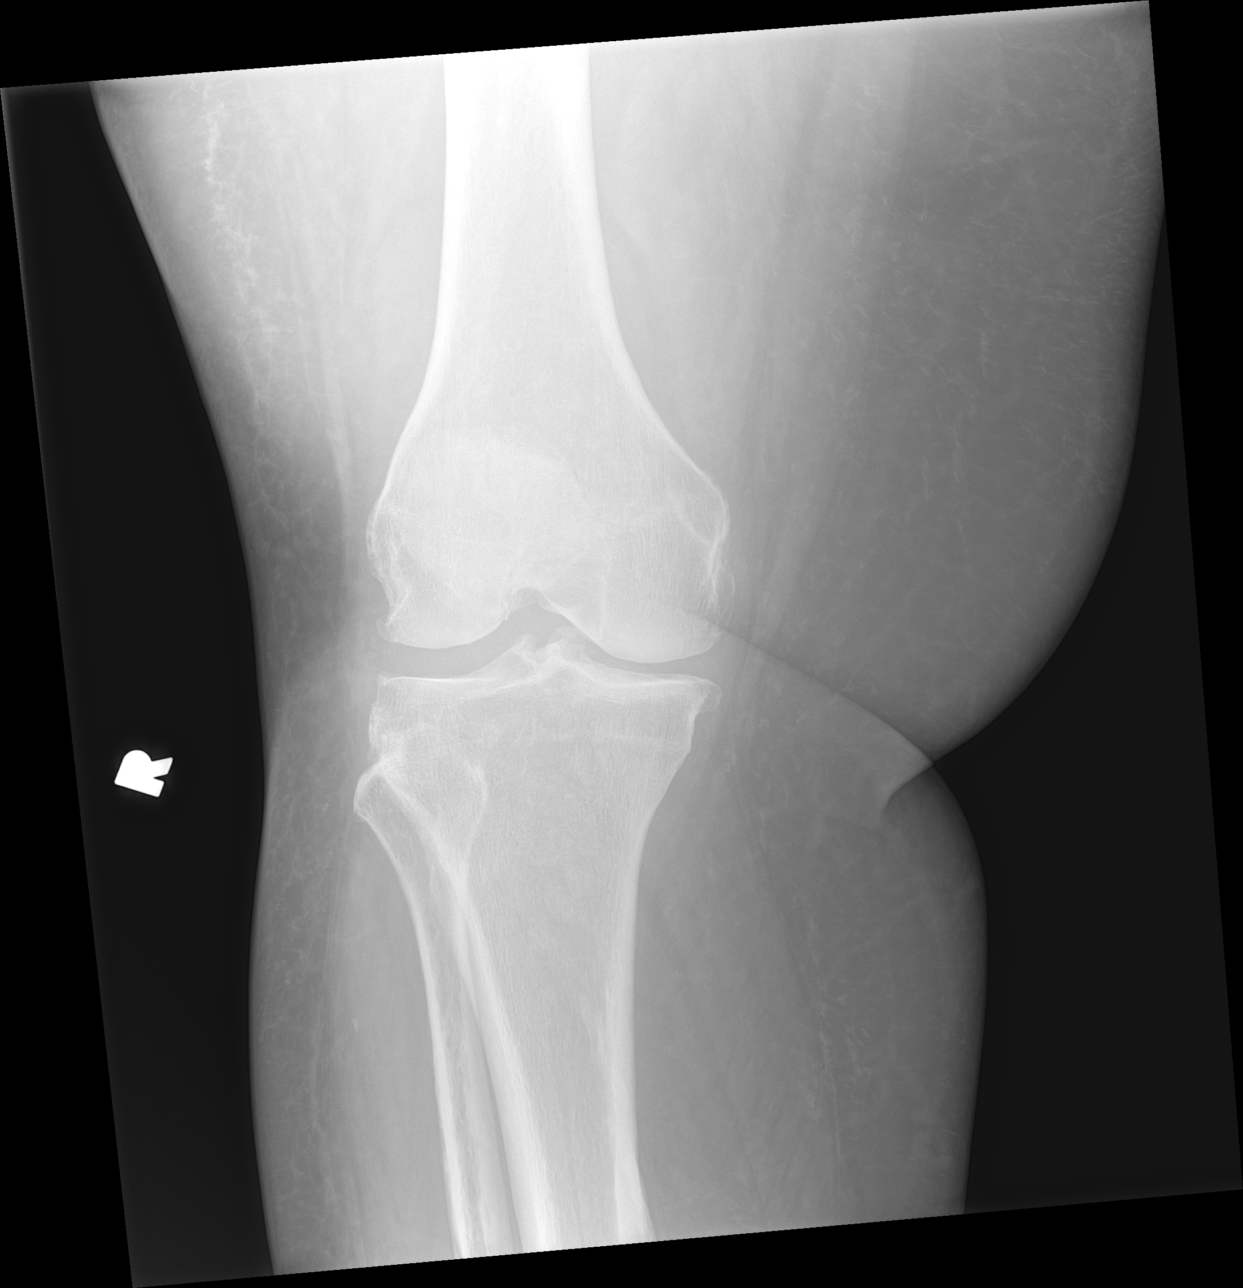

[knee obl (2 of 2)]
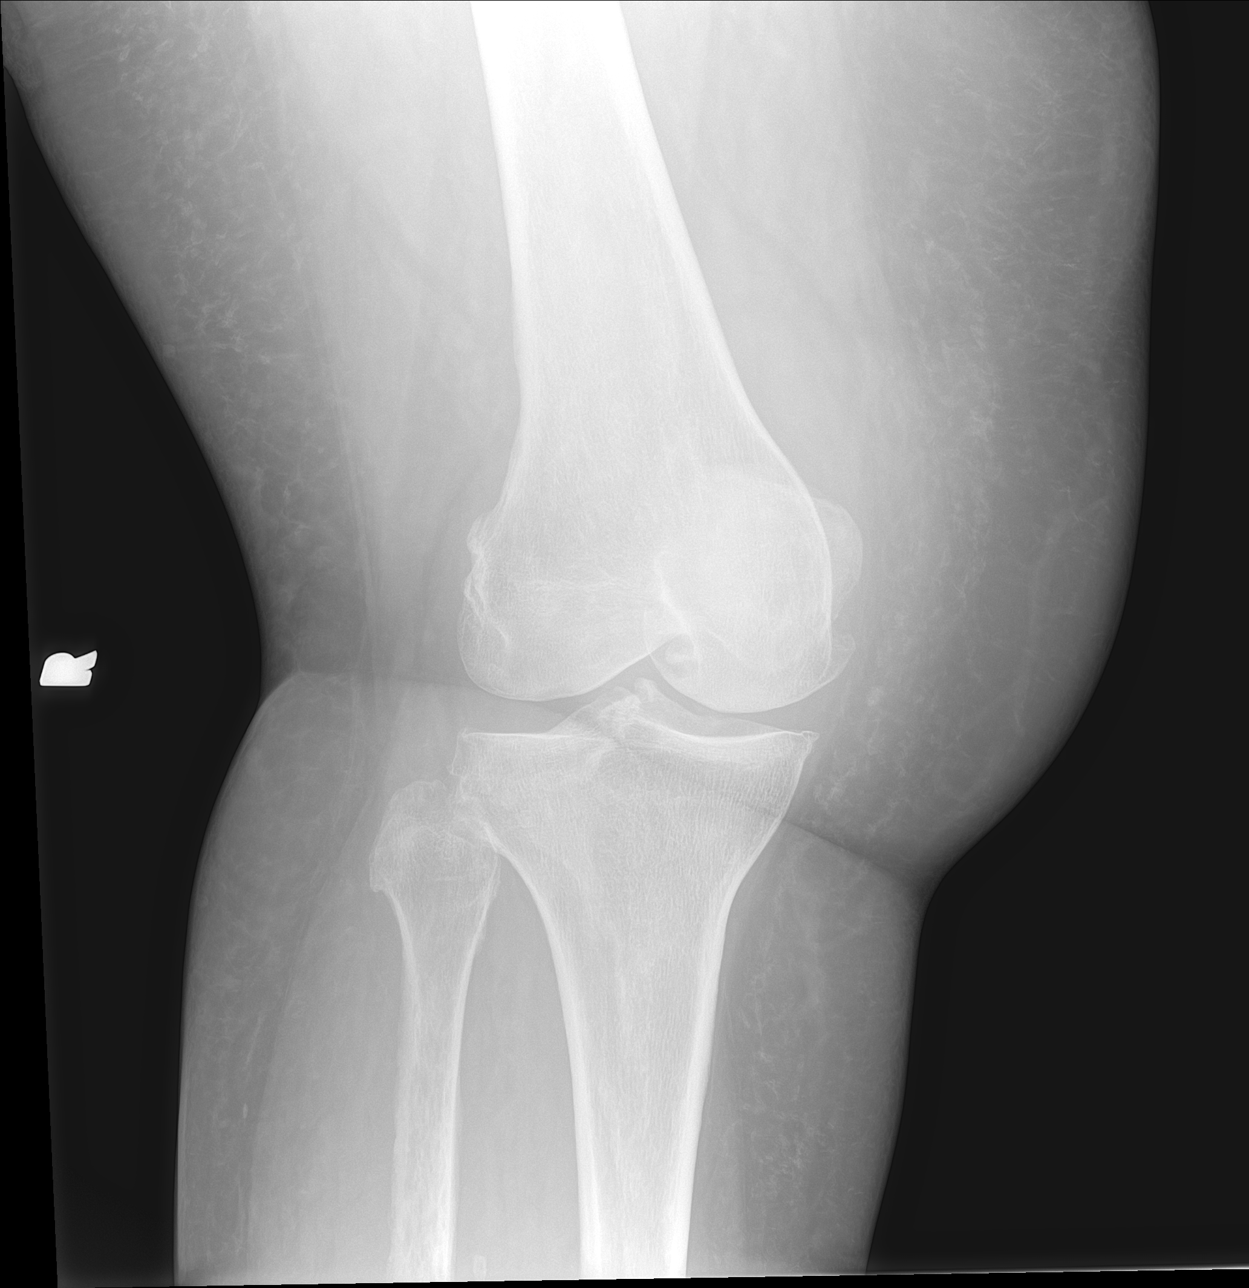

[knee lat]
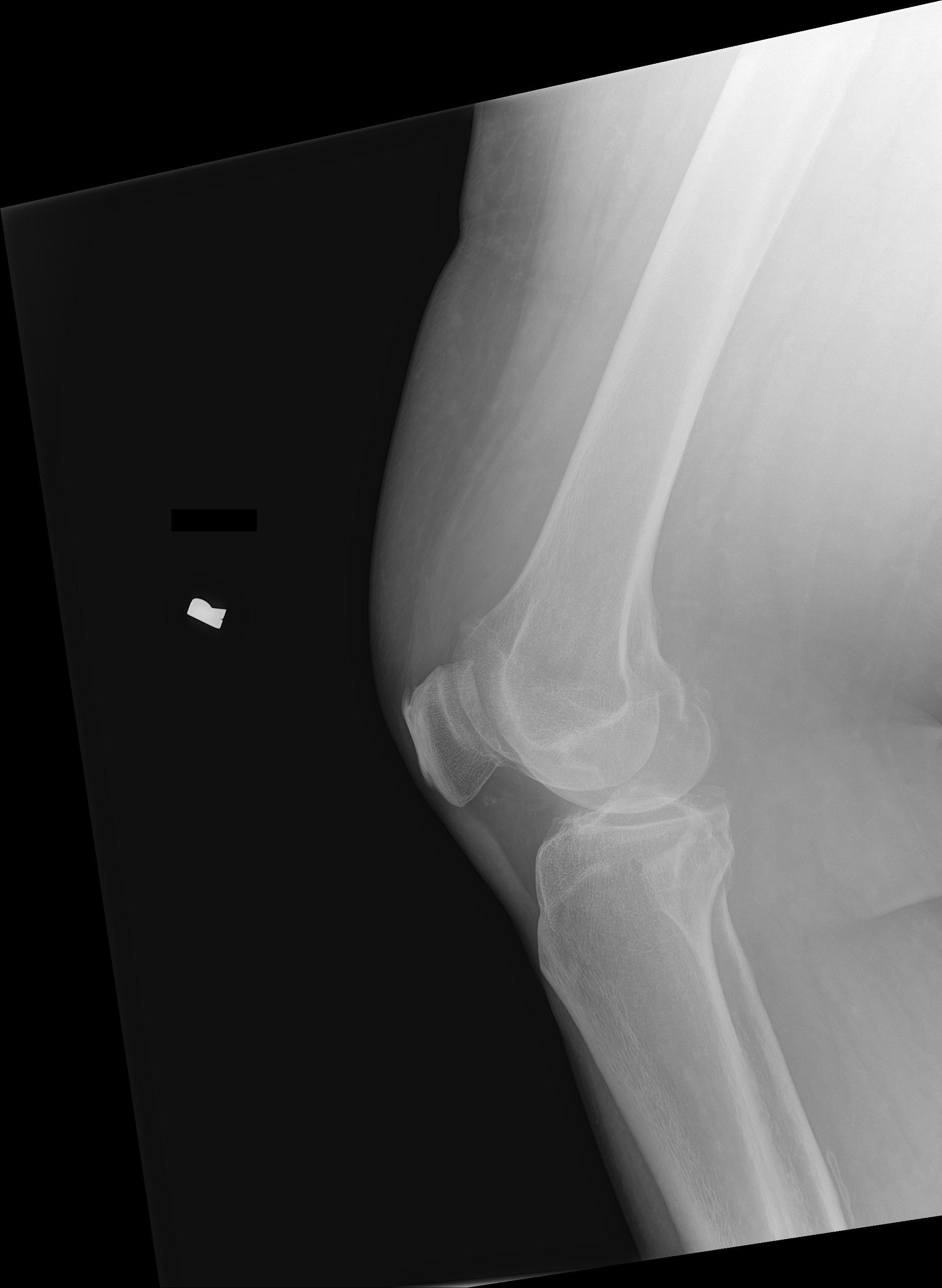

[4 of 4 positions shown; findings below may reference images not displayed]

FINDINGS: No fracture or dislocation is noted. Small suprapatellar joint
effusion is noted. Mild narrowing of medial joint space is noted.
IMPRESSION: Mild degenerative joint disease is noted medially. Small
suprapatellar joint effusion. No fracture or dislocation.

## 2023-12-12 DIAGNOSIS — F339 Major depressive disorder, recurrent, unspecified: Secondary | ICD-10-CM | POA: Diagnosis not present

## 2023-12-12 DIAGNOSIS — I10 Essential (primary) hypertension: Secondary | ICD-10-CM | POA: Diagnosis not present

## 2023-12-12 DIAGNOSIS — E1169 Type 2 diabetes mellitus with other specified complication: Secondary | ICD-10-CM | POA: Diagnosis not present

## 2023-12-12 DIAGNOSIS — Z6841 Body Mass Index (BMI) 40.0 and over, adult: Secondary | ICD-10-CM | POA: Diagnosis not present

## 2023-12-12 DIAGNOSIS — Z23 Encounter for immunization: Secondary | ICD-10-CM | POA: Diagnosis not present

## 2023-12-12 DIAGNOSIS — E559 Vitamin D deficiency, unspecified: Secondary | ICD-10-CM | POA: Diagnosis not present

## 2023-12-12 DIAGNOSIS — D696 Thrombocytopenia, unspecified: Secondary | ICD-10-CM | POA: Diagnosis not present

## 2023-12-12 DIAGNOSIS — I2510 Atherosclerotic heart disease of native coronary artery without angina pectoris: Secondary | ICD-10-CM | POA: Diagnosis not present

## 2024-01-13 ENCOUNTER — Other Ambulatory Visit: Payer: Self-pay

## 2024-01-13 MED ORDER — ISOSORBIDE MONONITRATE ER 60 MG PO TB24: ORAL_TABLET | ORAL | 8 refills | Status: DC

## 2024-03-12 DIAGNOSIS — Z6841 Body Mass Index (BMI) 40.0 and over, adult: Secondary | ICD-10-CM | POA: Diagnosis not present

## 2024-03-12 DIAGNOSIS — E1165 Type 2 diabetes mellitus with hyperglycemia: Secondary | ICD-10-CM | POA: Diagnosis not present

## 2024-03-12 DIAGNOSIS — I10 Essential (primary) hypertension: Secondary | ICD-10-CM | POA: Diagnosis not present

## 2024-03-12 DIAGNOSIS — E1169 Type 2 diabetes mellitus with other specified complication: Secondary | ICD-10-CM | POA: Diagnosis not present

## 2024-03-12 DIAGNOSIS — E11618 Type 2 diabetes mellitus with other diabetic arthropathy: Secondary | ICD-10-CM | POA: Diagnosis not present

## 2024-05-08 ENCOUNTER — Other Ambulatory Visit: Payer: Self-pay | Admitting: Cardiology

## 2024-05-16 DIAGNOSIS — M9903 Segmental and somatic dysfunction of lumbar region: Secondary | ICD-10-CM | POA: Diagnosis not present

## 2024-05-16 DIAGNOSIS — M9902 Segmental and somatic dysfunction of thoracic region: Secondary | ICD-10-CM | POA: Diagnosis not present

## 2024-05-16 DIAGNOSIS — M62830 Muscle spasm of back: Secondary | ICD-10-CM | POA: Diagnosis not present

## 2024-05-16 DIAGNOSIS — M9905 Segmental and somatic dysfunction of pelvic region: Secondary | ICD-10-CM | POA: Diagnosis not present

## 2024-05-18 DIAGNOSIS — M9905 Segmental and somatic dysfunction of pelvic region: Secondary | ICD-10-CM | POA: Diagnosis not present

## 2024-05-18 DIAGNOSIS — M9902 Segmental and somatic dysfunction of thoracic region: Secondary | ICD-10-CM | POA: Diagnosis not present

## 2024-05-18 DIAGNOSIS — M62830 Muscle spasm of back: Secondary | ICD-10-CM | POA: Diagnosis not present

## 2024-05-18 DIAGNOSIS — M9903 Segmental and somatic dysfunction of lumbar region: Secondary | ICD-10-CM | POA: Diagnosis not present

## 2024-05-23 DIAGNOSIS — M9903 Segmental and somatic dysfunction of lumbar region: Secondary | ICD-10-CM | POA: Diagnosis not present

## 2024-05-23 DIAGNOSIS — M9905 Segmental and somatic dysfunction of pelvic region: Secondary | ICD-10-CM | POA: Diagnosis not present

## 2024-05-23 DIAGNOSIS — M9902 Segmental and somatic dysfunction of thoracic region: Secondary | ICD-10-CM | POA: Diagnosis not present

## 2024-05-23 DIAGNOSIS — M62830 Muscle spasm of back: Secondary | ICD-10-CM | POA: Diagnosis not present

## 2024-05-25 DIAGNOSIS — M62830 Muscle spasm of back: Secondary | ICD-10-CM | POA: Diagnosis not present

## 2024-05-25 DIAGNOSIS — M9903 Segmental and somatic dysfunction of lumbar region: Secondary | ICD-10-CM | POA: Diagnosis not present

## 2024-05-25 DIAGNOSIS — M9905 Segmental and somatic dysfunction of pelvic region: Secondary | ICD-10-CM | POA: Diagnosis not present

## 2024-05-25 DIAGNOSIS — M9902 Segmental and somatic dysfunction of thoracic region: Secondary | ICD-10-CM | POA: Diagnosis not present

## 2024-05-30 DIAGNOSIS — M9903 Segmental and somatic dysfunction of lumbar region: Secondary | ICD-10-CM | POA: Diagnosis not present

## 2024-05-30 DIAGNOSIS — M9902 Segmental and somatic dysfunction of thoracic region: Secondary | ICD-10-CM | POA: Diagnosis not present

## 2024-05-30 DIAGNOSIS — M9905 Segmental and somatic dysfunction of pelvic region: Secondary | ICD-10-CM | POA: Diagnosis not present

## 2024-05-30 DIAGNOSIS — M62830 Muscle spasm of back: Secondary | ICD-10-CM | POA: Diagnosis not present

## 2024-06-06 DIAGNOSIS — E1165 Type 2 diabetes mellitus with hyperglycemia: Secondary | ICD-10-CM | POA: Diagnosis not present

## 2024-06-12 DIAGNOSIS — E1169 Type 2 diabetes mellitus with other specified complication: Secondary | ICD-10-CM | POA: Diagnosis not present

## 2024-06-12 DIAGNOSIS — Z6841 Body Mass Index (BMI) 40.0 and over, adult: Secondary | ICD-10-CM | POA: Diagnosis not present

## 2024-06-12 DIAGNOSIS — E7800 Pure hypercholesterolemia, unspecified: Secondary | ICD-10-CM | POA: Diagnosis not present

## 2024-06-12 DIAGNOSIS — I10 Essential (primary) hypertension: Secondary | ICD-10-CM | POA: Diagnosis not present

## 2024-06-19 DIAGNOSIS — Z1231 Encounter for screening mammogram for malignant neoplasm of breast: Secondary | ICD-10-CM | POA: Diagnosis not present

## 2024-08-15 DIAGNOSIS — M62830 Muscle spasm of back: Secondary | ICD-10-CM | POA: Diagnosis not present

## 2024-08-15 DIAGNOSIS — M9905 Segmental and somatic dysfunction of pelvic region: Secondary | ICD-10-CM | POA: Diagnosis not present

## 2024-08-15 DIAGNOSIS — M9902 Segmental and somatic dysfunction of thoracic region: Secondary | ICD-10-CM | POA: Diagnosis not present

## 2024-08-15 DIAGNOSIS — M9903 Segmental and somatic dysfunction of lumbar region: Secondary | ICD-10-CM | POA: Diagnosis not present

## 2024-08-20 DIAGNOSIS — N898 Other specified noninflammatory disorders of vagina: Secondary | ICD-10-CM | POA: Diagnosis not present

## 2024-08-24 DIAGNOSIS — I10 Essential (primary) hypertension: Secondary | ICD-10-CM | POA: Diagnosis not present

## 2024-08-24 DIAGNOSIS — E7800 Pure hypercholesterolemia, unspecified: Secondary | ICD-10-CM | POA: Diagnosis not present

## 2024-08-24 DIAGNOSIS — E1165 Type 2 diabetes mellitus with hyperglycemia: Secondary | ICD-10-CM | POA: Diagnosis not present

## 2024-08-24 DIAGNOSIS — K219 Gastro-esophageal reflux disease without esophagitis: Secondary | ICD-10-CM | POA: Diagnosis not present

## 2024-08-24 DIAGNOSIS — I2510 Atherosclerotic heart disease of native coronary artery without angina pectoris: Secondary | ICD-10-CM | POA: Diagnosis not present

## 2024-08-24 DIAGNOSIS — E559 Vitamin D deficiency, unspecified: Secondary | ICD-10-CM | POA: Diagnosis not present

## 2024-08-24 DIAGNOSIS — F339 Major depressive disorder, recurrent, unspecified: Secondary | ICD-10-CM | POA: Diagnosis not present

## 2024-09-25 DIAGNOSIS — I2510 Atherosclerotic heart disease of native coronary artery without angina pectoris: Secondary | ICD-10-CM | POA: Diagnosis not present

## 2024-09-25 DIAGNOSIS — I10 Essential (primary) hypertension: Secondary | ICD-10-CM | POA: Diagnosis not present

## 2024-09-25 DIAGNOSIS — F339 Major depressive disorder, recurrent, unspecified: Secondary | ICD-10-CM | POA: Diagnosis not present

## 2024-09-25 DIAGNOSIS — E7800 Pure hypercholesterolemia, unspecified: Secondary | ICD-10-CM | POA: Diagnosis not present

## 2024-09-25 DIAGNOSIS — J302 Other seasonal allergic rhinitis: Secondary | ICD-10-CM | POA: Diagnosis not present

## 2024-10-26 DIAGNOSIS — I10 Essential (primary) hypertension: Secondary | ICD-10-CM | POA: Diagnosis not present

## 2024-10-26 DIAGNOSIS — E559 Vitamin D deficiency, unspecified: Secondary | ICD-10-CM | POA: Diagnosis not present

## 2024-10-26 DIAGNOSIS — E1165 Type 2 diabetes mellitus with hyperglycemia: Secondary | ICD-10-CM | POA: Diagnosis not present

## 2024-10-26 DIAGNOSIS — F339 Major depressive disorder, recurrent, unspecified: Secondary | ICD-10-CM | POA: Diagnosis not present

## 2024-10-26 DIAGNOSIS — K219 Gastro-esophageal reflux disease without esophagitis: Secondary | ICD-10-CM | POA: Diagnosis not present

## 2024-10-26 DIAGNOSIS — I2510 Atherosclerotic heart disease of native coronary artery without angina pectoris: Secondary | ICD-10-CM | POA: Diagnosis not present

## 2024-11-15 DIAGNOSIS — H52209 Unspecified astigmatism, unspecified eye: Secondary | ICD-10-CM | POA: Diagnosis not present

## 2024-11-15 DIAGNOSIS — H524 Presbyopia: Secondary | ICD-10-CM | POA: Diagnosis not present

## 2024-11-15 DIAGNOSIS — H52203 Unspecified astigmatism, bilateral: Secondary | ICD-10-CM | POA: Diagnosis not present

## 2024-11-15 DIAGNOSIS — H5203 Hypermetropia, bilateral: Secondary | ICD-10-CM | POA: Diagnosis not present

## 2024-11-23 DIAGNOSIS — I2510 Atherosclerotic heart disease of native coronary artery without angina pectoris: Secondary | ICD-10-CM | POA: Diagnosis not present

## 2024-11-23 DIAGNOSIS — E7800 Pure hypercholesterolemia, unspecified: Secondary | ICD-10-CM | POA: Diagnosis not present

## 2024-11-23 DIAGNOSIS — F339 Major depressive disorder, recurrent, unspecified: Secondary | ICD-10-CM | POA: Diagnosis not present

## 2024-11-23 DIAGNOSIS — I10 Essential (primary) hypertension: Secondary | ICD-10-CM | POA: Diagnosis not present

## 2024-11-23 DIAGNOSIS — E1165 Type 2 diabetes mellitus with hyperglycemia: Secondary | ICD-10-CM | POA: Diagnosis not present

## 2024-11-23 DIAGNOSIS — K219 Gastro-esophageal reflux disease without esophagitis: Secondary | ICD-10-CM | POA: Diagnosis not present

## 2024-11-23 DIAGNOSIS — E559 Vitamin D deficiency, unspecified: Secondary | ICD-10-CM | POA: Diagnosis not present

## 2024-11-23 NOTE — Progress Notes (Signed)
 Andrea Hughes                                          MRN: [id number]   11/23/2024   The [facility]BCI Quality Team Specialist reviewed this patient medical record for the purposes of chart review for care gap closure. The following were reviewed: chart review for care gap closure-controlling blood pressure.    VBCI Quality Team

## 2024-12-05 ENCOUNTER — Encounter: Payer: Self-pay | Admitting: Cardiology

## 2024-12-05 ENCOUNTER — Encounter: Payer: Self-pay | Admitting: Family Medicine

## 2024-12-05 ENCOUNTER — Encounter: Payer: Self-pay | Admitting: *Deleted

## 2024-12-05 ENCOUNTER — Ambulatory Visit: Attending: Cardiology | Admitting: Cardiology

## 2024-12-05 ENCOUNTER — Ambulatory Visit: Payer: Self-pay | Admitting: Cardiology

## 2024-12-05 VITALS — BP 138/78 | HR 63 | Ht 65.0 in | Wt 272.6 lb

## 2024-12-05 DIAGNOSIS — I10 Essential (primary) hypertension: Secondary | ICD-10-CM | POA: Diagnosis not present

## 2024-12-05 DIAGNOSIS — I25119 Atherosclerotic heart disease of native coronary artery with unspecified angina pectoris: Secondary | ICD-10-CM

## 2024-12-05 DIAGNOSIS — E782 Mixed hyperlipidemia: Secondary | ICD-10-CM | POA: Diagnosis not present

## 2024-12-05 DIAGNOSIS — I2510 Atherosclerotic heart disease of native coronary artery without angina pectoris: Secondary | ICD-10-CM

## 2024-12-05 MED ORDER — NITROGLYCERIN 0.4 MG SL SUBL: 0.4000 mg | SUBLINGUAL_TABLET | SUBLINGUAL | 3 refills | Status: AC | PRN

## 2024-12-05 NOTE — Progress Notes (Signed)
° ° °  Cardiology Office Note  Date: 12/05/2024   ID: Andrea Hughes, Andrea Hughes [birth year], MRN [id number]  History of Present Illness: Andrea Hughes is a 68 y.o. female last seen in September 2024 by Ms. Franchester RIGGERS, I reviewed her note.  Our last encounter was in December 2023.  She is here for a routine visit, does not indicate any angina or interval nitroglycerin  use.  She has not been able to exercise at the gym, reports back pain as a limitation.  No palpitations, dizziness, or syncope.  She is working on glucose control with her PCP office.  Recently having some problems with hypoglycemia.  I went over her medications which are stable from a cardiac perspective.  She does need a refill for a fresh bottle of nitroglycerin .  Plan to request interval lab work regarding lipid status.  She remains on Lipitor  80 mg daily.  I reviewed her ECG today which shows sinus rhythm with left anterior fascicular block and increased voltage with repolarization abnormalities.  Physical Exam: VS:  BP 138/78 (BP Location: Right Arm)   Pulse 63   Ht 5' 5 (1.651 m)   Wt 272 lb 9.6 oz (123.7 kg)   SpO2 98%   BMI 45.36 kg/m , BMI Body mass index is 45.36 kg/m.  Wt Readings from Last 3 Encounters:  12/05/24 272 lb 9.6 oz (123.7 kg)  09/01/23 260 lb 12.8 oz (118.3 kg)  12/15/22 259 lb (117.5 kg)    General: Patient appears comfortable at rest. HEENT: Conjunctiva and lids normal. Neck: Supple, no elevated JVP or carotid bruits. Lungs: Clear to auscultation, nonlabored breathing at rest. Cardiac: Regular rate and rhythm, no S3 or significant systolic murmur.  ECG:  An ECG dated 09/01/2023 was personally reviewed today and demonstrated:  Sinus rhythm with leftward axis, increased voltage, nonspecific ST-T wave abnormalities.  Labwork:  August 2023: Cholesterol 151, triglycerides 128, HDL 51, LDL 77  Other Studies Reviewed Today:  No interval cardiac testing for review today.  Assessment and  Plan:  1.  CAD status post DES to the mid LAD in October 2015.  Follow-up coronary angiography in 2017 showed jailed third diagonal branch managed medically with patent LAD stent site.  She remains clinically stable with no interval angina.  Plan to refill fresh bottle of nitroglycerin .  ECG reviewed and stable.  Continue aspirin  81 mg daily, Imdur  60 mg daily, and Lipitor  80 mg daily.  2.  Primary hypertension.  No change to current regimen which also includes Lopressor  100 mg twice daily and Hyzaar 100/12.5 mg daily.  3.  Mixed hyperlipidemia.  She is on Lipitor  80 mg daily.  Requesting interval lipid panel from PCP.  Disposition:  Follow up 1 year.  Signed, Jayson JUDITHANN Sierras, M.D., F.A.C.C. [facility] at [facility]

## 2024-12-05 NOTE — Patient Instructions (Addendum)

## 2024-12-07 NOTE — Telephone Encounter (Signed)
 Patient was returning call. Please advise ?

## 2024-12-10 MED ORDER — EZETIMIBE 10 MG PO TABS: 10.0000 mg | ORAL_TABLET | Freq: Every day | ORAL | 3 refills | Status: AC

## 2024-12-10 NOTE — Telephone Encounter (Signed)
 The patient has been notified of the result and verbalized understanding.  All questions (if any) were answered. Bernett Dorothyann LABOR, RN 12/10/2024 9:13 AM  Patient agreeable to start Zetia  10 mg daily, sent to [facility]

## 2024-12-10 NOTE — Telephone Encounter (Signed)
-----   Message from Barbee DELENA Sharps sent at 12/07/2024  8:27 AM EST -----

## 2025-01-08 NOTE — Progress Notes (Signed)
 MAIDA WIDGER                                          MRN: [id number]   01/08/2025   The VBCI Quality Team Specialist reviewed this patient medical record for the purposes of chart review for care gap closure. The following were reviewed: abstraction for care gap closure-controlling blood pressure.    VBCI Quality Team
